# Patient Record
Sex: Male | Born: 1975 | Race: White | Hispanic: No | Marital: Married | Smoking: Current every day smoker
Health system: Southern US, Community
[De-identification: ages and names within clinical notes are randomized; demographics above are authoritative.]

## PROBLEM LIST (undated history)

## (undated) DIAGNOSIS — I10 Essential (primary) hypertension: Secondary | ICD-10-CM

## (undated) HISTORY — PX: COLON SURGERY: SHX602

## (undated) HISTORY — PX: APPENDECTOMY: SHX54

---

## 2005-02-06 ENCOUNTER — Ambulatory Visit: Payer: Self-pay

## 2005-03-04 ENCOUNTER — Ambulatory Visit: Payer: Self-pay | Admitting: Unknown Physician Specialty

## 2011-04-02 ENCOUNTER — Ambulatory Visit: Payer: Self-pay | Admitting: Family Medicine

## 2011-04-27 ENCOUNTER — Ambulatory Visit: Payer: Self-pay

## 2015-01-30 ENCOUNTER — Inpatient Hospital Stay: Payer: Self-pay | Admitting: Surgery

## 2015-02-10 ENCOUNTER — Ambulatory Visit: Payer: Self-pay | Admitting: Surgery

## 2015-02-10 ENCOUNTER — Inpatient Hospital Stay: Payer: Self-pay | Admitting: Surgery

## 2015-02-22 ENCOUNTER — Ambulatory Visit: Payer: Self-pay | Admitting: Surgery

## 2015-04-05 ENCOUNTER — Other Ambulatory Visit
Admit: 2015-04-05 | Disposition: A | Discharge status: Home / Self Care | Payer: Self-pay | Attending: Surgery | Admitting: Surgery

## 2015-04-05 LAB — COMPREHENSIVE METABOLIC PANEL
ALBUMIN: 3.8 g/dL
ANION GAP: 8 (ref 7–16)
AST: 24 U/L
Alkaline Phosphatase: 116 U/L
BUN: 5 mg/dL — AB
Bilirubin,Total: 0.4 mg/dL
CALCIUM: 9 mg/dL
CHLORIDE: 104 mmol/L
CREATININE: 0.77 mg/dL
Co2: 27 mmol/L
EGFR (African American): >60
Glucose: 96 mg/dL
Potassium: 3.8 mmol/L
SGPT (ALT): 35 U/L
Sodium: 139 mmol/L
Total Protein: 7.2 g/dL

## 2015-04-05 LAB — CBC WITH DIFFERENTIAL/PLATELET
Basophil #: 0 10*3/uL (ref 0.0–0.1)
Basophil %: 0.7 %
EOS ABS: 0.3 10*3/uL (ref 0.0–0.7)
Eosinophil %: 4.1 %
HCT: 40.8 % (ref 40.0–52.0)
HGB: 13.7 g/dL (ref 13.0–18.0)
Lymphocyte #: 2.7 10*3/uL (ref 1.0–3.6)
Lymphocyte %: 39.6 %
MCH: 27.7 pg (ref 26.0–34.0)
MCHC: 33.5 g/dL (ref 32.0–36.0)
MCV: 83 fL (ref 80–100)
MONO ABS: 0.3 x10 3/mm (ref 0.2–1.0)
Monocyte %: 5.2 %
NEUTROS PCT: 50.4 %
Neutrophil #: 3.4 10*3/uL (ref 1.4–6.5)
PLATELETS: 240 10*3/uL (ref 150–440)
RBC: 4.93 10*6/uL (ref 4.40–5.90)
RDW: 16.2 % — AB (ref 11.5–14.5)
WBC: 6.7 10*3/uL (ref 3.8–10.6)

## 2015-04-10 LAB — SURGICAL PATHOLOGY

## 2015-04-11 ENCOUNTER — Ambulatory Visit: Admit: 2015-04-11 | Disposition: A | Discharge status: Home / Self Care | Payer: Self-pay | Admitting: Surgery

## 2015-04-16 NOTE — Discharge Summary (Signed)
PATIENT NAME:  Seth Ramirez, Seth Ramirez MR#:  [id number] DATE OF BIRTH:  1976/04/19  DATE OF ADMISSION:  01/30/2015 DATE OF DISCHARGE:  02/02/2015  DISCHARGE DIAGNOSES:  1.  Acute ruptured appendicitis, status post laparoscopic appendectomy.  2.  History of knee surgery x 2.   DISCHARGE MEDICATIONS: As follows: Metronidazole 500 mg p.o. q. 8 hours, Percocet 1 to 2 tabs p.o. q. 6 hours p.r.n. pain, ciprofloxacin 500 mg  p.o. q. 12 hours.   INDICATION FOR ADMISSION: Mr. Seth Ramirez is a pleasant 39 year old with approximately 10 days of right lower quadrant pain. He had a CT scan concerning for ruptured appendicitis and leukocytosis. He was brought to the operating room for a laparoscopic appendectomy. Postoperatively a drain was placed. He was given clear liquids and IV pain medications. As he had increased his bowel function he was transitioned to p.o. pain medications  and regular diet. At the time of discharge, he was taking good p.o. with good p.o. pain control and voiding and stooling without difficulties.   DISCHARGE INSTRUCTIONS: As follows:  Mr. Seth Ramirez is  to call or return to the ED if has increased pain, nausea, vomiting, redness, drainage from incision.    ____________________________ Si Raiderhristopher A. Lyfe Reihl, MD cal:AT D: 02/15/2015 15:49:51 ET T: 02/16/2015 02:03:56 ET JOB#: [id number]  cc: Cristal Deerhristopher A. Nirali Magouirk, MD, <Dictator> Jarvis NewcomerHRISTOPHER A Anuoluwapo Mefferd MD ELECTRONICALLY SIGNED 02/21/2015 11:20

## 2015-04-16 NOTE — H&P (Signed)
PATIENT NAME:  Seth Ramirez, NOLT MR#:  [id number] DATE OF BIRTH:  [birth year]  DATE OF ADMISSION:  01/30/2015  PRIMARY CARE PHYSICIAN: None.   ADMITTING PHYSICIAN: Quentin Ore III, MD   CHIEF COMPLAINT: Abdominal pain.   HISTORY OF PRESENT ILLNESS:  Avory Mimbs is a 39 year old gentleman seen in the Emergency Room with an approximate 10-day history of right lower quadrant pain. He had a gastrointestinal bug by his history, causing profound nausea and vomiting and he developed right lower quadrant pain after that episode. He thought the pain represented a muscle strain from his vomiting and he continued to work. He denied any fever or chills.He  has not vomited any further. The pain increased over the course of the last couple of days and he went to the walk-in clinic yesterday, where is working diagnosis of muscle cramping was confirmed. The patient did not have laboratory values drawn at that time by his recollection. His pain worsened over the course today and he came to the Emergency Room for further evaluation. Workup demonstrated an elevated white blood cell count. A CT scan demonstrated right lower quadrant of free fluid, markedly dilated appendix, periappendiceal stranding suggestive of acute, possibly ruptured, appendicitis. The surgical service was consulted.   He denies any other previous GI problems. He denies history of hepatitis, yellow jaundice, pancreatitis, peptic ulcer disease, gallbladder disease or diverticulitis. He has had no previous abdominal surgery. His only previous surgery is anterior cruciate ligament repair x2. He denies history of cardiac disease, hypertension, diabetes or thyroid problems. He is a cigarette smoker. He does not drink alcohol regularly, but drink in the past. He is a Company secretary, doing manual labor.   REVIEW OF SYSTEMS: Otherwise unremarkable. Specifically, he has no chest pain, shortness of breath urinary symptoms or neurologic symptoms. A 10 point  review of systems was carried out and he has no other significant findings other than those noted above.   FAMILY HISTORY: Negative for cardiac disease, hypertension, or diabetes.   MEDICATIONS: He takes no medicines regularly.   ALLERGIES: HE HAS NO MEDICAL ALLERGIES.   PHYSICAL EXAMINATION:  GENERAL: He is an alert, pleasant gentleman in no significant distress. Does complain of mild right lower quadrant pain. His blood pressure is 149/93, heart rate 80 and regular. He is afebrile.  HEENT: Normal ears, normal eyes with no scleral icterus or pupillary abnormalities.  LYMPH SYSTEM: Reveals no adenopathy in his axilla, cervical area or groin.  PULMONARY EXAMINATION: Reveals no adventitious sounds with normal pulmonary excursion. He has equal breath sounds bilaterally.  CARDIAC: No murmurs or gallops. He seems to be in normal sinus rhythm.  GASTROINTESTINAL EXAMINATION: Reveals marked right lower quadrant tenderness with guarding and rebound. No hernias are noted. He has active bowel sounds.  MUSCULOSKELETAL SYSTEM: Reveals no deformities. Full range of motion.  NEUROLOGIC: Reveals no unilateral or focal abnormalities with good reflexes.  SKIN EXAMINATION: Reveals multiple small, erythematous dermatitis area across his abdomen and chest. He said these areas are chronic. No other skin abnormalities or bruising is identified.  PSYCHIATRIC: Normal orientation, normal affect.   I have independently reviewed his CT scan his laboratory values. He does appear to have acute appendicitis, possibly even ruptured, with a degree of inflammatory change noted on CT scan. We will plan to consider surgical intervention. I will set him up for surgery and then plan to pass his care to my associate, who will be assuming the service this evening. The patient is in agreement with  this plan.    ____________________________ Carmie Endalph L. Ely III, MD rle:ap D: 01/30/2015 18:33:27 ET T: 01/30/2015 19:07:17  ET JOB#: [id number]  cc: Quentin Orealph L. Ely III, MD, <Dictator> Quentin OreALPH L ELY MD ELECTRONICALLY SIGNED 01/31/2015 17:59

## 2015-04-16 NOTE — Op Note (Signed)
PATIENT NAME:  Seth Ramirez, [name] MR#:  [id number] DATE OF BIRTH:  1976/04/22  DATE OF PROCEDURE:  01/30/2015  ATTENDING PHYSICIAN:  Cristal Deerhristopher A. Deontez Klinke, MD   PREOPERATIVE DIAGNOSIS:  Acute appendicitis.   POSTOPERATIVE DIAGNOSIS:  Acute appendicitis, ruptured.   PROCEDURE PERFORMED:  Laparoscopic appendectomy.   ANESTHESIA:  General.   ESTIMATED BLOOD LOSS:  50 mL.   COMPLICATIONS:  None.   SPECIMEN:  Appendix.   INDICATION FOR PROCEDURE:  Mr. [name] is a pleasant 39 year old who presents with 10 days of right lower quadrant pain. He had a CT scan concerning for appendicitis and an elevated white blood cell count. He was brought to the operating room for laparoscopic appendectomy.   DETAILS OF PROCEDURE:  Informed consent was obtained. Mr. [name] was brought to the operating room suite. He was induced. Endotracheal tube was placed. General anesthesia was administered. A Foley was placed. His abdomen was prepped and draped in standard surgical fashion. A timeout was then performed correctly identifying patient name, operative site, and procedure to be performed. A supraumbilical incision was made and deepened down to the fascia. The fascia was incised. The peritoneum was entered. Two stay sutures were placed through the fasciotomy. A Hasson trocar was placed in the abdomen. Under direct visualization, a 5 mm left lower quadrant and right suprapubic catheter were placed. The appendix was visualized. It was inflamed. It was grasped. There was purulence and an abscess cavity around it. I followed the appendix back to the cecum. A stapler was placed across the base of the appendix flush with the cecum. It was stapled. Because the mesentery was extremely shortened, I used cautery to take down the mesoappendix. The appendix was then taken out through an Endo Catch bag. The abdomen was irrigated with 3 liters of normal saline. After happy with the irrigation, a round JP drain was placed out the  left lower quadrant incision into the appendiceal fossa. The drain was sutured with a 3-0 nylon suture. After happy with hemostasis, all trocars were removed under direct visualization and 0 Vicryl was used to close the supraumbilical fascia. After all ports were removed under direct visualization, 4-0 Monocryl was used to close the skin incisions. Sterile dressings were placed over the wound. The patient was then awoken, and Foley was removed. The patient was extubated. There were no immediate complications. Needle, sponge, and instrument counts were correct at the end of the procedure.    ____________________________ Si Raiderhristopher A. Morgin Halls, MD cal:nb D: 01/30/2015 23:57:34 ET T: 01/31/2015 02:52:17 ET JOB#: [id number]  cc: Cristal Deerhristopher A. Charlaine Utsey, MD, <Dictator> Jarvis NewcomerHRISTOPHER A Vollie Aaron MD ELECTRONICALLY SIGNED 02/02/2015 22:32

## 2015-04-16 NOTE — H&P (Signed)
History of Present Illness Seth Ramirez who underwent lap appy for ruptured appendicitis 11 days ago. He was D/C-ed on PO Cipro and Flagyl and initially did well, but over the last 4 days has developed anorexia, diarrhea, and RLQ pain. He denies fever and chills. No vomiting, and no BRBPR or melena. I personally reviewed the CT images from yesterday. They show at least 2 intraabdominal abscesses: the pelvic abscess and interloop abscesses cannot be percutaneously drained according to I.R. The other surrounds the cecum.   Past History additional ROS- complete 10 system review is negative except for pertinent positives and negatives of the GI system mentioned in the HPI. h/o knee surgery and lap appy as above   Code Status Full Code   ALLERGIES:  No Known Allergies:   HOME MEDICATIONS: Medication Instructions Status  metroNIDAZOLE 500 mg oral tablet 1 tab(s) orally every 8 hours Active  acetaminophen-oxyCODONE 325 mg-5 mg oral tablet 1 tab(s) orally every 6 hours, As needed, moderate pain (4-6/10) Active  ciprofloxacin 500 mg oral tablet 1 tab(s) orally every 12 hours Active   Family and Social History:  Family History Mom living at 4666, has CA (of unknown origin to pt). Dad died of an MI at 8665.   Social History positive  tobacco, negative ETOH, negative Illicit drugs, married, kids, works in Scientist, water qualitya warehouse, smokes 1 PPD, doesn't drink any ETOH (although he did in the remote past)   + Tobacco Current (within 1 year)   Place of Living Home   Review of Systems:  Fever/Chills No   Cough No   Sputum No   Abdominal Pain Yes   Diarrhea Yes   Constipation No   Nausea/Vomiting No   SOB/DOE No   Chest Pain No   Dysuria No   Tolerating PT Yes   Tolerating Diet No  anorexia   Physical Exam:  GEN well developed, well nourished, no acute distress   HEENT pink conjunctivae, PERRL, hearing intact to voice, moist oral mucosa, Oropharynx clear   NECK supple  No masses  trachea  midline   RESP normal resp effort  clear BS  no use of accessory muscles   CARD regular rate  no murmur  no thrills  no JVD  no Rub   ABD positive tenderness  RLQ   LYMPH negative neck   EXTR negative cyanosis/clubbing, negative edema   SKIN normal to palpation, skin turgor good   NEURO cranial nerves intact, negative tremor, follows commands, motor/sensory function intact   PSYCH alert, A+O to time, place, person   Radiology Results: CT:    26-Feb-16 08:52, CT Abdomen and Pelvis With Contrast  CT Abdomen and Pelvis With Contrast  REASON FOR EXAM:    Abd pain  COMMENTS:       PROCEDURE: KCT - KCT ABDOMEN/PELVIS W  - Feb 10 2015  8:52AM     ADDENDUM REPORT: 02/10/2015 09:35    ADDENDUM:  Study discussed by telephone with Dr. Tiney Rougealph Ely (covering fo rDr.  Ida RogueHRISTOPHER LUNDQUIST ) on 02/10/2015 at 0916 hours.    We discussed the small subcapsular liver collection (see #3) which I  favor at this time to be sterile rather than liver abscess.    Electronically Signed    By: Odessa FlemingH  Hall M.D.    On: 02/10/2015 09:35    CLINICAL DATA:  39 year old male status post ruptured appendix and  surgery with drain placed. Drain hold for days ago, with recurrent  chills fever diaphoresis abdominal pain  and vomiting. Initial  encounter.    EXAM:  CT ABDOMEN AND PELVIS WITH CONTRAST    TECHNIQUE:  Multidetector CT imaging of the abdomen and pelvis was performed  using the standard protocol following bolus administration of  intravenous contrast.    CONTRAST:  100 mL Omnipaque 350.    COMPARISON:  CT Abdomen and Pelvis 01/30/2015 and earlier.    FINDINGS:  Negative lung bases.  No pericardial or pleural effusion.    No acute osseous abnormality identified.    The bladder is diminutive. There is a new rim enhancing 64 x 62 x 69  mm abscess in the deep pelvis, situated between the rectum and  seminal vesicles. This was not area free fluid earlier.  The distal colon is mostly  collapsed. There is patchy free fluid in  the lower abdominal mesentery and stranding along both pelvic  sidewalls. Oral contrast has reached the descending colon. The  splenic flexure and transverse colon are within normal limits.    There is abundant inflammation in the right abdomen along the right  colon an the terminal ileum. There is severe wall thickening of the  terminal ileum and cecum with mass effect on the bowel lumen. There  is a rim enhancing abscess insinuated around the cecum and distal  small bowel in the right abdomen. The most confluent part of this  abscess encompasses 48 x 76 x 65 mm. See coronal image 45.    No upstream dilated small bowel. Stomach and duodenum are within  normal limits.  There is a new posterior inferior right hepatic lobe subcapsular  fluid collection measuring 3 cm (series 2, image 29) this is near  [place].    Elsewhere liver enhancement is within normal limits. Negative  gallbladder. Portal venous system is patent. Spleen, pancreas,  adrenal glands, kidneys, and major arterial structures are within  normal limits. No pneumoperitoneum.     IMPRESSION:  1. Moderate to large right abdominal abscess is discontinuous in  shape and has insinuated round distal small bowel loops, the  terminal ileum, and the cecum. The most confluent component measures  5-7 cm. See coronal image 45.  2. Round abscess in the deep pelvis measuring 6-7 cm diameter,  situated between the bladder and rectum.  3. Small 3 cm right posterior hepatic lobe subcapsular abscess  versus sterile fluid collection.  4. Severe terminal ileum and proximal colon wall thickening with  mass effect on the bowel lumen, but no definite mechanical  obstruction at this time.    Electronically Signed:  By: Odessa Fleming M.D.  On: 02/10/2015 09:06         Verified By: Kevan Ny. HALL, M.D.,    Assessment/Admission Diagnosis Pelvic, pericecal, and possibly interloop intraabdominal  abscesses, s/p lap appy   Plan Admit, IVF, IV ABx, operative drainage by Dr Juliann Pulse this evening.  Pt and wife understand the plan and agree.   Electronic Signatures: Claude Manges (MD)  (Signed [phone] 12:57)  Authored: CHIEF COMPLAINT and HISTORY, ALLERGIES, HOME MEDICATIONS, FAMILY AND SOCIAL HISTORY, REVIEW OF SYSTEMS, PHYSICAL EXAM, Radiology, ASSESSMENT AND PLAN   Last Updated: 26-Feb-16 12:57 by Claude Manges (MD)

## 2015-04-16 NOTE — Discharge Summary (Signed)
PATIENT NAME:  Seth Ramirez, Seth Ramirez MR#:  [id number] DATE OF BIRTH:  1976/03/19  DATE OF ADMISSION:  02/10/2015 DATE OF DISCHARGE:  02/17/2015  DISCHARGE DIAGNOSES:  1.  Abdominal abscess x 2 status post laparoscopic appendectomy.  2.  Status post drainage of right lower quadrant and pubic abscess with ileocecectomy.  3.  History of knee surgery.   DISCHARGE MEDICATIONS:  1.  Percocet 1-2 tabs p.o. q. 6 hours p.r.n. pain. 2.  Keflex 500 mg p.o. q.i.d.  3.  Flagyl 500 mg q. 8 hours.  INDICATION FOR ADMISSION: Mr. [name] is a pleasant [age] year old male who underwent laparoscopic appendectomy for ruptured appendicitis. He had a CT scan which showed a large intra-abdominal abscess and also is having increased pain, leukocytosis, nausea, vomiting, and diarrhea.  He is admitted for possible surgical management of postoperative abscess status post ruptured appendectomy.   HOSPITAL COURSE AS FOLLOWS:  Mr. [name] was admitted and underwent above-mentioned surgery. Postoperatively, he was n.p.o. until his bowels began working, and later advanced to a regular clear liquid, and then a regular diet. He was also given IV pain medicine by a PCA initially, and as he began tolerating diet this was transitioned to p.o. pain medications. He is also transitioned from IV to p.o. antibiotics as his condition improved.  At the time of discharge, he was taking good p.o. with good p.o. pain control, was voiding, and stooling without difficulty.   DISCHARGE INSTRUCTIONS: Mr. [name] is to call or return to the ED if has increased pain, nausea, vomiting, redness, drainage from incision. [facility]    ____________________________ Si Raiderhristopher A. Chicquita Mendel, MD cal:sp D: 03/07/2015 23:27:02 ET T: 03/08/2015 09:18:15 ET JOB#: [id number]  cc: Cristal Deerhristopher A. Marcile Fuquay, MD, <Dictator> Jarvis NewcomerHRISTOPHER A Christyne Mccain MD ELECTRONICALLY SIGNED 03/09/2015 19:44

## 2015-04-16 NOTE — Op Note (Signed)
PATIENT NAME:  Seth Ramirez, Seth Ramirez MR#:  [id number] DATE OF BIRTH:  19-May-1976  DATE OF PROCEDURE:  02/10/2015  ATTENDING SURGEON: Cristal Deer A. Varnika Butz, MD  PREOPERATIVE DIAGNOSIS: Intra-abdominal abscesses status post laparoscopic appendectomy for a ruptured appendicitis.   POSTOPERATIVE DIAGNOSIS: Intra-abdominal abscesses status post laparoscopic appendectomy for a ruptured appendicitis.  PROCEDURE PERFORMED:  1.  Attempted diagnostic laparoscopy with drainage of abscesses.  2.  Exploratory laparotomy with drainage of abscesses.  3.  Ileocecectomy with primary anastomosis.    ESTIMATED BLOOD LOSS: 300 mL.   COMPLICATIONS: None.   SPECIMEN: Ileocecum.   COMPLICATIONS: None.   INDICATION FOR SURGERY: [name] is a pleasant 39 year old male who presents with 11 days of worsening abdominal pain. He had a leukocytosis and a CT scan concerning for multiple intra-abdominal abscesses, which were not amenable to percutaneous drainage. He was brought to the operating room for drainage of abscesses.   DETAILS OF PROCEDURE: Informed consent was obtained. [name] was brought to the operating room suite. He was induced. Endotracheal tube was placed, general anesthesia was administered. His abdomen was prepped and draped in standard surgical fashion. A timeout was then performed correctly identify patient name, operative site, and procedure performed. A supraumbilical incision was made through the previous, now closed, supraumbilical incision and depend down to the fascia. The previously placed fascial sutures were clipped. The peritoneum was entered. Two stay sutures were placed through the fasciotomy. Hassan trocar was placed in the abdomen. It was insufflated. There were a large amount of adhesions. A trocar was placed at through the area of the previous drain. I then attempted to take down the adhesions from the abdominal wall with some difficulties; however, I felt that it was not  progressing and everything was very stuck. I then converted to an open incision. I then opened the pelvic abscess cavity with purulence through blunt dissection. The sigmoid colon was quite inflamed and hard. I then proceeded to run the small bowel and there was a significant amount of terminal ileum stuck in the right upper quadrant to an abscess. This was a partially deserosalized. I then mobilized the right colon with some difficulty, at least at the cecum. Upon examination, there was approximately 20 cm of deserosalized ileum. It appeared to be that there was enough length to do a side-to-side functional anastomosis; however, I did find a small enterotomy at the very friable terminal ileum as well as a small colotomy in the cecum. Attempts to close these were futile due to the quality of the tissue, which kept tearing with suture placement. I then elected for an ileocecectomy due to the fact that I was afraid if I closed the pinholes that this would form a leak. I then removed damage bowel to area of fresh uninvolved small bowel and large bowel. These were ligated with a GIA 75 then suture ligatures in the mesentery. I then performed a side-to-side functional end-to-end anastomosis by creating a common channel with a GIA 75 stapler and then closing the enterocolostomy with a TX 60. I then irrigated the abdomen with 10 L of normal saline. Hemostasis was obtained. Following this, I closed the wound with 2 looped #1 PDS run from both directions. They were tied in the middle. The wound was then loosely approximated with staples and packing gauze was placed due to the fact that the wound was dirty. Sterile dressing was then placed over the wound. The patient was then awoken, extubated and brought to the postanesthesia care unit. There  were no immediate complications. Needle, sponge, and instrument counts were correct at the end of the procedure.     ____________________________ [name],  MD cal:bm D: 02/11/2015 19:24:00 ET T: 02/12/2015 01:18:32 ET JOB#: [id number]  cc: Cristal Deerhristopher A. Christinea Brizuela, MD, <Dictator> Jarvis NewcomerHRISTOPHER A Lynnix Schoneman MD ELECTRONICALLY SIGNED 02/21/2015 11:27

## 2015-06-14 ENCOUNTER — Emergency Department: Payer: 59

## 2015-06-14 ENCOUNTER — Other Ambulatory Visit: Payer: 59

## 2015-06-14 ENCOUNTER — Other Ambulatory Visit: Payer: Self-pay

## 2015-06-14 ENCOUNTER — Emergency Department
Admission: EM | Admit: 2015-06-14 | Discharge: 2015-06-14 | Disposition: A | Discharge status: Home / Self Care | Payer: 59 | Attending: Emergency Medicine | Admitting: Emergency Medicine

## 2015-06-14 ENCOUNTER — Encounter: Payer: Self-pay | Admitting: *Deleted

## 2015-06-14 DIAGNOSIS — Z720 Tobacco use: Secondary | ICD-10-CM | POA: Diagnosis not present

## 2015-06-14 DIAGNOSIS — R109 Unspecified abdominal pain: Secondary | ICD-10-CM | POA: Diagnosis present

## 2015-06-14 DIAGNOSIS — Z9049 Acquired absence of other specified parts of digestive tract: Secondary | ICD-10-CM | POA: Diagnosis not present

## 2015-06-14 LAB — CBC WITH DIFFERENTIAL/PLATELET
BASOS ABS: 0.1 10*3/uL (ref 0–0.1)
BASOS PCT: 1 %
EOS ABS: 0.1 10*3/uL (ref 0–0.7)
Eosinophils Relative: 2 %
HCT: 43.2 % (ref 40.0–52.0)
HEMOGLOBIN: 14.4 g/dL (ref 13.0–18.0)
Lymphocytes Relative: 37 %
Lymphs Abs: 2.6 10*3/uL (ref 1.0–3.6)
MCH: 27.8 pg (ref 26.0–34.0)
MCHC: 33.4 g/dL (ref 32.0–36.0)
MCV: 83.1 fL (ref 80.0–100.0)
MONOS PCT: 4 %
Monocytes Absolute: 0.3 10*3/uL (ref 0.2–1.0)
NEUTROS ABS: 4 10*3/uL (ref 1.4–6.5)
NEUTROS PCT: 56 %
PLATELETS: 247 10*3/uL (ref 150–440)
RBC: 5.2 MIL/uL (ref 4.40–5.90)
RDW: 16.3 % — AB (ref 11.5–14.5)
WBC: 7.2 10*3/uL (ref 3.8–10.6)

## 2015-06-14 LAB — COMPREHENSIVE METABOLIC PANEL
ALT: 15 U/L — ABNORMAL LOW (ref 17–63)
ANION GAP: 9 (ref 5–15)
AST: 18 U/L (ref 15–41)
Albumin: 4.1 g/dL (ref 3.5–5.0)
Alkaline Phosphatase: 92 U/L (ref 38–126)
BUN: 9 mg/dL (ref 6–20)
CO2: 27 mmol/L (ref 22–32)
Calcium: 8.8 mg/dL — ABNORMAL LOW (ref 8.9–10.3)
Chloride: 103 mmol/L (ref 101–111)
Creatinine, Ser: 0.87 mg/dL (ref 0.61–1.24)
GFR calc Af Amer: >60 mL/min (ref >60)
GFR calc non Af Amer: >60 mL/min (ref >60)
Glucose, Bld: 108 mg/dL — ABNORMAL HIGH (ref 65–99)
Potassium: 3 mmol/L — ABNORMAL LOW (ref 3.5–5.1)
SODIUM: 139 mmol/L (ref 135–145)
TOTAL PROTEIN: 7.3 g/dL (ref 6.5–8.1)
Total Bilirubin: 0.5 mg/dL (ref 0.3–1.2)

## 2015-06-14 LAB — LIPASE, BLOOD: Lipase: 30 U/L (ref 22–51)

## 2015-06-14 MED ORDER — DICYCLOMINE HCL 10 MG PO CAPS: 10.0000 mg | ORAL_CAPSULE | Freq: Three times a day (TID) | ORAL | Status: AC

## 2015-06-14 MED ORDER — IOHEXOL 240 MG/ML SOLN
25.0000 mL | Freq: Once | INTRAMUSCULAR | Status: AC | PRN
  Administered 2015-06-14: 25 mL via INTRAVENOUS
  Filled 2015-06-14: qty 25

## 2015-06-14 MED ORDER — IOHEXOL 300 MG/ML  SOLN
100.0000 mL | Freq: Once | INTRAMUSCULAR | Status: AC | PRN
  Administered 2015-06-14: 100 mL via INTRAVENOUS
  Filled 2015-06-14: qty 100

## 2015-06-14 MED ORDER — METOCLOPRAMIDE HCL 5 MG PO TABS: 5.0000 mg | ORAL_TABLET | Freq: Three times a day (TID) | ORAL | Status: AC

## 2015-06-14 MED ORDER — SODIUM CHLORIDE 0.9 % IV BOLUS (SEPSIS)
1000.0000 mL | Freq: Once | INTRAVENOUS | Status: AC
  Administered 2015-06-14: 1000 mL via INTRAVENOUS

## 2015-06-14 NOTE — ED Notes (Signed)
Pt complains of right sided abdominal pain starting Monday, pt denies any other symptoms

## 2015-06-14 NOTE — Discharge Instructions (Signed)
Your blood tests and CT scan looked good. It is unclear what is causing the notable pain even having. He do have a moderate amount of stool in the right upper portion of her colon as well as some gas in that area. This may be causing some discomfort. Take Reglan as needed for nausea. This may help with discomfort. You may also take an anti-spasmodic for your bowels.  Follow-up with Dr. Juliann PulseLundquist in 1-2 days area if your pain worsens or you have other urgent concerns, return to the emergency department.  Abdominal Pain Many things can cause abdominal pain. Usually, abdominal pain is not caused by a disease and will improve without treatment. It can often be observed and treated at home. Your health care provider will do a physical exam and possibly order blood tests and X-rays to help determine the seriousness of your pain. However, in many cases, more time must pass before a clear cause of the pain can be found. Before that point, your health care provider may not know if you need more testing or further treatment. HOME CARE INSTRUCTIONS  Monitor your abdominal pain for any changes. The following actions may help to alleviate any discomfort you are experiencing:  Only take over-the-counter or prescription medicines as directed by your health care provider.  Do not take laxatives unless directed to do so by your health care provider.  Try a clear liquid diet (broth, tea, or water) as directed by your health care provider. Slowly move to a bland diet as tolerated. SEEK MEDICAL CARE IF:  You have unexplained abdominal pain.  You have abdominal pain associated with nausea or diarrhea.  You have pain when you urinate or have a bowel movement.  You experience abdominal pain that wakes you in the night.  You have abdominal pain that is worsened or improved by eating food.  You have abdominal pain that is worsened with eating fatty foods.  You have a fever. SEEK IMMEDIATE MEDICAL CARE IF:    Your pain does not go away within 2 hours.  You keep throwing up (vomiting).  Your pain is felt only in portions of the abdomen, such as the right side or the left lower portion of the abdomen.  You pass bloody or black tarry stools. MAKE SURE YOU:  Understand these instructions.   Will watch your condition.   Will get help right away if you are not doing well or get worse.  Document Released: 09/11/2005 Document Revised: 12/07/2013 Document Reviewed: 08/11/2013 [facility] Patient Information 2015 [employer] [place]. This information is not intended to replace advice given to you by your health care provider. Make sure you discuss any questions you have with your health care provider.

## 2015-06-14 NOTE — ED Provider Notes (Signed)
[facility] Emergency Department Provider Note  ____________________________________________  Time seen: 1450  I have reviewed the triage vital signs and the nursing notes.   HISTORY  Chief Complaint Abdominal Pain     HPI Seth Ramirez is a 39 y.o. male with a complicated recent surgical history. He had appendicitis earlier this year. He apparently developed some form of infection afterwards and the need to do a repeat surgery. The repeat surgery removed part of his intestines. He now presents to the emergency department with pain in his right abdomen that began on Monday. This pain has been moderate until today when he felt some sort of a "pull". He reports he had been told to return to the emergency department if he felt any form of a "pop".  He reports some nausea and vomiting. He has not had any diarrhea.     History reviewed. No pertinent past medical history.  There are no active problems to display for this patient.   Past Surgical History  Procedure Laterality Date  . Appendectomy    . Colon surgery      Current Outpatient Rx  Name  Route  Sig  Dispense  Refill  . dicyclomine (BENTYL) 10 MG capsule   Oral   Take 1 capsule (10 mg total) by mouth 4 (four) times daily -  before meals and at bedtime.   16 capsule   0   . metoCLOPramide (REGLAN) 5 MG tablet   Oral   Take 1 tablet (5 mg total) by mouth 3 (three) times daily.   15 tablet   0     Allergies Review of patient's allergies indicates no known allergies.  No family history on file.  Social History History  Substance Use Topics  . Smoking status: Current Every Day Smoker -- 1.00 packs/day  . Smokeless tobacco: Not on file  . Alcohol Use: No    Review of Systems  Constitutional: Negative for fever. ENT: Negative for sore throat. Cardiovascular: Negative for chest pain. Respiratory: Negative for shortness of breath. Gastrointestinal: Positive for abdominal pain.  Complex past surgical history. See history of present illness Genitourinary: Negative for dysuria. Musculoskeletal: No myalgias or injuries. Skin: Negative for rash. Neurological: Negative for headaches   10-point ROS otherwise negative.  ____________________________________________   PHYSICAL EXAM:  VITAL SIGNS: ED Triage Vitals  Enc Vitals Group     BP 06/14/15 1208 174/100 mmHg     Pulse Rate 06/14/15 1208 86     Resp 06/14/15 1208 20     Temp 06/14/15 1208 98.2 F (36.8 C)     Temp Source 06/14/15 1208 Oral     SpO2 06/14/15 1208 98 %     Weight 06/14/15 1208 205 lb (92.987 kg)     Height 06/14/15 1208  (1.778 m)     Head Cir --      Peak Flow --      Pain Score --      Pain Loc --      Pain Edu? --      Excl. in GC? --     Constitutional:  Alert and oriented. Well appearing and in no distress. ENT   Head: Normocephalic and atraumatic.   Nose: No congestion/rhinnorhea. Cardiovascular: Normal rate, regular rhythm, no murmur noted Respiratory:  Normal respiratory effort, no tachypnea.    Breath sounds are clear and equal bilaterally.  Gastrointestinal: Soft, No distention.  Patient does have some tenderness that is generally diffuse but  primarily on the right. When pushing on the left side of the abdomen, he has some referred pain to the midline. The higher up on the left we push the greater the amount of discomfort to the right. He has no guarding and no rebound. Back: No muscle spasm, no tenderness, no CVA tenderness. Musculoskeletal: No deformity noted. Nontender with normal range of motion in all extremities.  No noted edema. Neurologic:  Normal speech and language. No gross focal neurologic deficits are appreciated.  Skin:  Skin is warm, dry. 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 Psychiatric: Mood and affect are normal. Speech and behavior are normal.  ____________________________________________    LABS (pertinent  positives/negatives)  White blood cell count 7.2 with normal hemoglobin 14.4 Sodium 139, potassium 3.0, normal renal function.  ____________________________________________   EKG  ED ECG REPORT I, Lanyiah Brix W, the attending physician, personally viewed and interpreted this ECG.   Date: 06/14/2015  EKG Time: 1225  Rate: 75  Rhythm: Normal sinus rhythm  Axis: Left axis deviation at -31  Intervals: QTC of 460  ST&T Change: Downward T-wave in lead 3 and flat in aVF.   ____________________________________________    RADIOLOGY  CT abdomen and pelvis: IMPRESSION: 1. There is no acute inflammatory process within abdomen or pelvis. 2. Postsurgical changes are noted with midline anterior abdominal wall scarring. The patient is status post partial right hemicolectomy. 3. Moderate stool noted in hepatic flexure of the colon. Some colonic gas noted in transverse colon and rectum. There is no evidence of colonic obstruction. No colitis or diverticulitis. 4. Mild hepatic fatty infiltration. 5. No hydronephrosis or hydroureter. No small bowel obstruction.  ____________________________________________  ____________________________________________   INITIAL IMPRESSION / ASSESSMENT AND PLAN / ED COURSE  Pertinent labs & imaging results that were available during my care of the patient were reviewed by me and considered in my medical decision making (see chart for details).  This patient has a complex recent surgical history. While I am a little reluctant to put him through yet another CT scan, he is at moderate or higher risk for a complication. The patient and I have discussed the pros and cons of CT versus discharge and reevaluation and the patient prefers to have a CT today.  I have called and spoken with Natale LayMark Bird, MD to get his input on the next best step for this patient. He agrees with obtaining the CT.  ----------------------------------------- 5:30 PM on  06/14/2015 -----------------------------------------  The CT scan does not show any acute process. Post surgical changes are noted. He does have moderate stool and some gas in the hepatic flexure and transverse colon.  I will discharge the patient for follow-up with general surgery.  ____________________________________________   FINAL CLINICAL IMPRESSION(S) / ED DIAGNOSES  Final diagnoses:  Abdominal pain in male  Abdominal pain 9 months post surgery    Darien Ramusavid W Amri Lien, MD 06/14/15 [phone]

## 2020-04-27 ENCOUNTER — Ambulatory Visit: Payer: 59 | Attending: Internal Medicine

## 2020-04-27 DIAGNOSIS — Z23 Encounter for immunization: Secondary | ICD-10-CM

## 2020-04-27 NOTE — Progress Notes (Signed)
   Covid-19 Vaccination Clinic  Name:  Seth Ramirez    MRN: [id number] DOB: [birth year]  04/27/2020  [name] was observed post Covid-19 immunization for 15 minutes without incident. He was provided with Vaccine Information Sheet and instruction to access the V-Safe system.   [name] was instructed to call 911 with any severe reactions post vaccine: Marland Kitchen Difficulty breathing  . Swelling of face and throat  . A fast heartbeat  . A bad rash all over body  . Dizziness and weakness   Immunizations Administered    Name Date Dose VIS Date Route   Pfizer COVID-19 Vaccine 04/27/2020  1:39 PM 0.3 mL 02/09/2019 Intramuscular   Manufacturer: [employer], [employer]   Lot: [id number]   NDC: [address]-9

## 2020-05-22 ENCOUNTER — Ambulatory Visit: Payer: Self-pay | Attending: Internal Medicine

## 2020-05-22 DIAGNOSIS — Z23 Encounter for immunization: Secondary | ICD-10-CM

## 2020-05-22 NOTE — Progress Notes (Signed)
° °  Covid-19 Vaccination Clinic  Name:  YEDIDYA DUDDY    MRN: [id number] DOB: July 24, 1976  05/22/2020  Mr. Bencosme was observed post Covid-19 immunization for 15 minutes without incident. He was provided with Vaccine Information Sheet and instruction to access the V-Safe system.   Mr. Rowe was instructed to call 911 with any severe reactions post vaccine:  Difficulty breathing   Swelling of face and throat   A fast heartbeat   A bad rash all over body   Dizziness and weakness   Immunizations Administered    Name Date Dose VIS Date Route   Pfizer COVID-19 Vaccine 05/22/2020  4:11 PM 0.3 mL 02/09/2019 Intramuscular   Manufacturer: [employer], [employer]   Lot: [id number]   NDC: [address]-2

## 2021-03-07 ENCOUNTER — Emergency Department: Payer: Self-pay | Attending: Physician Assistant

## 2021-03-07 ENCOUNTER — Other Ambulatory Visit: Payer: Self-pay

## 2021-03-07 ENCOUNTER — Emergency Department
Admission: EM | Admit: 2021-03-07 | Discharge: 2021-03-07 | Disposition: A | Discharge status: Home / Self Care | Payer: No Typology Code available for payment source | Attending: Emergency Medicine | Admitting: Emergency Medicine

## 2021-03-07 DIAGNOSIS — X500XXA Overexertion from strenuous movement or load, initial encounter: Secondary | ICD-10-CM | POA: Insufficient documentation

## 2021-03-07 DIAGNOSIS — I10 Essential (primary) hypertension: Secondary | ICD-10-CM | POA: Diagnosis not present

## 2021-03-07 DIAGNOSIS — S46911A Strain of unspecified muscle, fascia and tendon at shoulder and upper arm level, right arm, initial encounter: Secondary | ICD-10-CM | POA: Insufficient documentation

## 2021-03-07 DIAGNOSIS — Y990 Civilian activity done for income or pay: Secondary | ICD-10-CM | POA: Diagnosis not present

## 2021-03-07 DIAGNOSIS — S4991XA Unspecified injury of right shoulder and upper arm, initial encounter: Secondary | ICD-10-CM | POA: Diagnosis present

## 2021-03-07 DIAGNOSIS — Z79899 Other long term (current) drug therapy: Secondary | ICD-10-CM | POA: Diagnosis not present

## 2021-03-07 DIAGNOSIS — F17200 Nicotine dependence, unspecified, uncomplicated: Secondary | ICD-10-CM | POA: Diagnosis not present

## 2021-03-07 LAB — COMPREHENSIVE METABOLIC PANEL
ALT: 14 U/L (ref 0–44)
AST: 17 U/L (ref 15–41)
Albumin: 4.5 g/dL (ref 3.5–5.0)
Alkaline Phosphatase: 92 U/L (ref 38–126)
Anion gap: 8 (ref 5–15)
BUN: 11 mg/dL (ref 6–20)
CO2: 24 mmol/L (ref 22–32)
Calcium: 9.1 mg/dL (ref 8.9–10.3)
Chloride: 105 mmol/L (ref 98–111)
Creatinine, Ser: 0.86 mg/dL (ref 0.61–1.24)
GFR, Estimated: >60 mL/min (ref >60)
Glucose, Bld: 87 mg/dL (ref 70–99)
Potassium: 3.5 mmol/L (ref 3.5–5.1)
Sodium: 137 mmol/L (ref 135–145)
Total Bilirubin: 1 mg/dL (ref 0.3–1.2)
Total Protein: 7.7 g/dL (ref 6.5–8.1)

## 2021-03-07 LAB — CBC WITH DIFFERENTIAL/PLATELET
Abs Immature Granulocytes: 0.02 10*3/uL (ref 0.00–0.07)
Basophils Absolute: 0 10*3/uL (ref 0.0–0.1)
Basophils Relative: 1 %
Eosinophils Absolute: 0.1 10*3/uL (ref 0.0–0.5)
Eosinophils Relative: 1 %
HCT: 48.6 % (ref 39.0–52.0)
Hemoglobin: 16.9 g/dL (ref 13.0–17.0)
Immature Granulocytes: 0 %
Lymphocytes Relative: 32 %
Lymphs Abs: 2.1 10*3/uL (ref 0.7–4.0)
MCH: 30 pg (ref 26.0–34.0)
MCHC: 34.8 g/dL (ref 30.0–36.0)
MCV: 86.3 fL (ref 80.0–100.0)
Monocytes Absolute: 0.4 10*3/uL (ref 0.1–1.0)
Monocytes Relative: 6 %
Neutro Abs: 3.8 10*3/uL (ref 1.7–7.7)
Neutrophils Relative %: 60 %
Platelets: 224 10*3/uL (ref 150–400)
RBC: 5.63 MIL/uL (ref 4.22–5.81)
RDW: 13.5 % (ref 11.5–15.5)
WBC: 6.4 10*3/uL (ref 4.0–10.5)
nRBC: 0 % (ref 0.0–0.2)

## 2021-03-07 MED ORDER — MELOXICAM 15 MG PO TABS: 15.0000 mg | ORAL_TABLET | Freq: Every day | ORAL | 2 refills | Status: AC

## 2021-03-07 MED ORDER — CLONIDINE HCL 0.1 MG PO TABS
0.2000 mg | ORAL_TABLET | Freq: Once | ORAL | Status: AC
  Administered 2021-03-07: 0.2 mg via ORAL
  Filled 2021-03-07: qty 2

## 2021-03-07 MED ORDER — AMLODIPINE BESYLATE 5 MG PO TABS: 5.0000 mg | ORAL_TABLET | Freq: Every day | ORAL | 11 refills | Status: DC

## 2021-03-07 MED ORDER — BACLOFEN 10 MG PO TABS: 10.0000 mg | ORAL_TABLET | Freq: Three times a day (TID) | ORAL | 0 refills | Status: AC

## 2021-03-07 NOTE — ED Provider Notes (Signed)
[facility] Emergency Department Provider Note  ____________________________________________   Event Date/Time   First MD Initiated Contact with Patient 03/07/21 1152     (approximate)  I have reviewed the triage vital signs and the nursing notes.   HISTORY  Chief Complaint Shoulder Injury    HPI Seth Ramirez is a 45 y.o. male presents emergency department complaining of right shoulder pain.  Patient states that he was lifting 120 pounds from the floor and throwing overhead.  Patient went to [facility] for [employer]. injury of his right shoulder but they noted that his blood pressure was extremely high and sent him here for hypertensive emergency.  Patient does not have a history of hypertension.  States his father does.  He has never been on medication.  He did drink NyQuil last night prior to going to bed.  He is taking no cold medications and did not drink a lot of caffeine today.   He denies headache.  Denies chest pain or shortness of breath   History reviewed. No pertinent past medical history.  There are no problems to display for this patient.   Past Surgical History:  Procedure Laterality Date  . APPENDECTOMY    . COLON SURGERY      Prior to Admission medications   Medication Sig Start Date End Date Taking? Authorizing Provider  amLODipine (NORVASC) 5 MG tablet Take 1 tablet (5 mg total) by mouth daily. 03/07/21 03/07/22 Yes Fisher, Roselyn Bering, PA-C  baclofen (LIORESAL) 10 MG tablet Take 1 tablet (10 mg total) by mouth 3 (three) times daily for 7 days. 03/07/21 03/14/21 Yes Fisher, Roselyn Bering, PA-C  meloxicam (MOBIC) 15 MG tablet Take 1 tablet (15 mg total) by mouth daily. 03/07/21 03/07/22 Yes Fisher, Roselyn Bering, PA-C  dicyclomine (BENTYL) 10 MG capsule Take 1 capsule (10 mg total) by mouth 4 (four) times daily -  before meals and at bedtime. 06/14/15   Darien Ramus, MD  metoCLOPramide (REGLAN) 5 MG tablet Take 1 tablet (5 mg total) by  mouth 3 (three) times daily. 06/14/15   Darien Ramus, MD    Allergies Patient has no known allergies.  No family history on file.  Social History Social History   Tobacco Use  . Smoking status: Current Every Day Smoker    Packs/day: 1.00  Substance Use Topics  . Alcohol use: No    Review of Systems  Constitutional: No fever/chills Eyes: No visual changes. ENT: No sore throat. Respiratory: Denies cough Cardiovascular: Denies chest pain Gastrointestinal: Denies abdominal pain Genitourinary: Negative for dysuria. Musculoskeletal: Negative for back pain.  Right shoulder pain Skin: Negative for rash. Psychiatric: no mood changes,     ____________________________________________   PHYSICAL EXAM:  VITAL SIGNS: ED Triage Vitals  Enc Vitals Group     BP 03/07/21 1117 (!) 209/131     Pulse Rate 03/07/21 1117 79     Resp 03/07/21 1117 16     Temp 03/07/21 1117 98.5 F (36.9 C)     Temp Source 03/07/21 1117 Oral     SpO2 03/07/21 1117 98 %     Weight 03/07/21 1119 220 lb (99.8 kg)     Height 03/07/21 1119 5\' 10"  (1.778 m)     Head Circumference --      Peak Flow --      Pain Score 03/07/21 1118 4     Pain Loc --      Pain Edu? --  Excl. in GC? --     Constitutional: Alert and oriented. Well appearing and in no acute distress.   Eyes: Conjunctivae are normal.  Head: Atraumatic. Nose: No congestion/rhinnorhea. Mouth/Throat: Mucous membranes are moist.   Neck:  supple no lymphadenopathy noted Cardiovascular: Normal rate, regular rhythm. Heart sounds are normal Respiratory: Normal respiratory effort.  No retractions, lungs c t a  GU: deferred Musculoskeletal: FROM all extremities, warm and well perfused, right shoulder tender along the trapezius and supraspinatus muscle, no rotator cuff injury noted at this time Neurologic:  Normal speech and language.  Skin:  Skin is warm, dry and intact. No rash noted. Psychiatric: Mood and affect are normal. Speech and  behavior are normal.  ____________________________________________   LABS (all labs ordered are listed, but only abnormal results are displayed)  Labs Reviewed  COMPREHENSIVE METABOLIC PANEL  CBC WITH DIFFERENTIAL/PLATELET  URINALYSIS, COMPLETE (UACMP) WITH MICROSCOPIC   ____________________________________________   ____________________________________________  RADIOLOGY  X-ray of the right shoulder  ____________________________________________   PROCEDURES  Procedure(s) performed: Catapres 0.2 mg p.o.  Procedures    ____________________________________________   INITIAL IMPRESSION / ASSESSMENT AND PLAN / ED COURSE  Pertinent labs & imaging results that were available during my care of the patient were reviewed by me and considered in my medical decision making (see chart for details).   Patient is a 45 year old male sent over by [facility] clinic for hypertensive emergency but also is here for [employer]. injury of the right shoulder.  See HPI physical exam shows patient's blood pressure be grossly elevated at 212/125  X-ray of the right shoulder Patient was given Catapres 0.2 CBC, metabolic panel,    X-ray of the right shoulder reviewed by me confirmed by radiology to be negative  CBC and metabolic panel are normal   Patient's blood pressure did decrease to 153/100 with the Catapres 0.2 mg.  The patient was given a prescription for amlodipine 5 mg daily.  He is to have his blood pressure rechecked at either the pharmacy, fire station, or EMS station in 1 week to ensure this is continuing to decrease.  If he is worsening he is to return the emergency department.  In regards to his shoulder, Worker's Comp. form filled out that he should have no use of the right shoulder for 1 week.  He is to take meloxicam and baclofen for shoulder.  Apply ice.  Return emergency department if worsening.  Follow-up orthopedics if not improving in 5 to 7 days.  Is discharged  stable condition.  Seth Ramirez was evaluated in Emergency Department on 03/07/2021 for the symptoms described in the history of present illness. He was evaluated in the context of the global COVID-19 pandemic, which necessitated consideration that the patient might be at risk for infection with the SARS-CoV-2 virus that causes COVID-19. Institutional protocols and algorithms that pertain to the evaluation of patients at risk for COVID-19 are in a state of rapid change based on information released by regulatory bodies including the CDC and federal and state organizations. These policies and algorithms were followed during the patient's care in the ED.    As part of my medical decision making, I reviewed the following data within the electronic MEDICAL RECORD NUMBER Nursing notes reviewed and incorporated, Labs reviewed , Old chart reviewed, Radiograph reviewed , Notes from prior ED visits and [place] Controlled Substance Database  ____________________________________________   FINAL CLINICAL IMPRESSION(S) / ED DIAGNOSES  Final diagnoses:  Primary hypertension  Shoulder strain, right, initial  encounter      NEW MEDICATIONS STARTED DURING THIS VISIT:  Discharge Medication List as of 03/07/2021  2:57 PM    START taking these medications   Details  amLODipine (NORVASC) 5 MG tablet Take 1 tablet (5 mg total) by mouth daily., Starting Wed 03/07/2021, Until Thu 03/07/2022, Normal    baclofen (LIORESAL) 10 MG tablet Take 1 tablet (10 mg total) by mouth 3 (three) times daily for 7 days., Starting Wed 03/07/2021, Until Wed 03/14/2021, Normal    meloxicam (MOBIC) 15 MG tablet Take 1 tablet (15 mg total) by mouth daily., Starting Wed 03/07/2021, Until Thu 03/07/2022, Normal         Note:  This document was prepared using Dragon voice recognition software and may include unintentional dictation errors.    Faythe Ghee, PA-C 03/07/21 1549    Merwyn Katos, MD 03/07/21 [phone]

## 2021-03-07 NOTE — ED Triage Notes (Addendum)
Pt to ER via POV from [facility] with c/o R shoulder pain. Pt reports lifting heavy boxes at work. Pt has full range of motion with associated pain worst with movement. Pain extends into neck.  [facility] clinic referred patient to ER d/t hypertension with systolic readings of 200. Pt BP reading 209/131 in triage. Pt denies hx of hypertension, no symptoms and denies headache or other symptoms related to blood pressure. Hypertension discussed with Md Bradler. Per Md, okay for flex care.

## 2021-03-07 NOTE — ED Notes (Signed)
Workers comp completed and turned in to [employer] for [employer].

## 2021-03-07 NOTE — Discharge Instructions (Signed)
Follow-up with orthopedics for continued right shoulder pain [facility] your [facility] Take the blood pressure medication as prescribed, recheck your blood pressure with either a pharmacy, EMS station, or fire station in 1 week Apply ice to your right shoulder, take the meloxicam and baclofen for the muscle strain

## 2021-03-07 NOTE — ED Notes (Signed)
Pt provided cup for urine sample 

## 2021-03-07 NOTE — ED Notes (Signed)
See triage note, pt filing workers comp, works for [employer]. 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 No obvious deformity noted

## 2021-03-07 NOTE — ED Notes (Signed)
Pt to xray

## 2021-03-11 ENCOUNTER — Emergency Department
Admission: EM | Admit: 2021-03-11 | Discharge: 2021-03-11 | Disposition: A | Discharge status: Home / Self Care | Payer: Self-pay | Attending: Emergency Medicine | Admitting: Emergency Medicine

## 2021-03-11 ENCOUNTER — Other Ambulatory Visit: Payer: Self-pay

## 2021-03-11 ENCOUNTER — Emergency Department: Payer: Self-pay

## 2021-03-11 ENCOUNTER — Encounter: Payer: Self-pay | Admitting: Emergency Medicine

## 2021-03-11 DIAGNOSIS — R778 Other specified abnormalities of plasma proteins: Secondary | ICD-10-CM

## 2021-03-11 DIAGNOSIS — F17200 Nicotine dependence, unspecified, uncomplicated: Secondary | ICD-10-CM | POA: Insufficient documentation

## 2021-03-11 DIAGNOSIS — I10 Essential (primary) hypertension: Secondary | ICD-10-CM | POA: Insufficient documentation

## 2021-03-11 DIAGNOSIS — R7989 Other specified abnormal findings of blood chemistry: Secondary | ICD-10-CM | POA: Insufficient documentation

## 2021-03-11 DIAGNOSIS — Z79899 Other long term (current) drug therapy: Secondary | ICD-10-CM | POA: Insufficient documentation

## 2021-03-11 HISTORY — DX: Essential (primary) hypertension: I10

## 2021-03-11 LAB — CBC WITH DIFFERENTIAL/PLATELET
Abs Immature Granulocytes: 0.02 10*3/uL (ref 0.00–0.07)
Basophils Absolute: 0.1 10*3/uL (ref 0.0–0.1)
Basophils Relative: 1 %
Eosinophils Absolute: 0.2 10*3/uL (ref 0.0–0.5)
Eosinophils Relative: 2 %
HCT: 47.1 % (ref 39.0–52.0)
Hemoglobin: 16.3 g/dL (ref 13.0–17.0)
Immature Granulocytes: 0 %
Lymphocytes Relative: 33 %
Lymphs Abs: 2.6 10*3/uL (ref 0.7–4.0)
MCH: 29.7 pg (ref 26.0–34.0)
MCHC: 34.6 g/dL (ref 30.0–36.0)
MCV: 85.9 fL (ref 80.0–100.0)
Monocytes Absolute: 0.5 10*3/uL (ref 0.1–1.0)
Monocytes Relative: 6 %
Neutro Abs: 4.5 10*3/uL (ref 1.7–7.7)
Neutrophils Relative %: 58 %
Platelets: 219 10*3/uL (ref 150–400)
RBC: 5.48 MIL/uL (ref 4.22–5.81)
RDW: 13.2 % (ref 11.5–15.5)
WBC: 7.8 10*3/uL (ref 4.0–10.5)
nRBC: 0 % (ref 0.0–0.2)

## 2021-03-11 LAB — COMPREHENSIVE METABOLIC PANEL
ALT: 17 U/L (ref 0–44)
AST: 17 U/L (ref 15–41)
Albumin: 4.1 g/dL (ref 3.5–5.0)
Alkaline Phosphatase: 73 U/L (ref 38–126)
Anion gap: 7 (ref 5–15)
BUN: 14 mg/dL (ref 6–20)
CO2: 27 mmol/L (ref 22–32)
Calcium: 8.8 mg/dL — ABNORMAL LOW (ref 8.9–10.3)
Chloride: 103 mmol/L (ref 98–111)
Creatinine, Ser: 0.78 mg/dL (ref 0.61–1.24)
GFR, Estimated: >60 mL/min (ref >60)
Glucose, Bld: 96 mg/dL (ref 70–99)
Potassium: 3.4 mmol/L — ABNORMAL LOW (ref 3.5–5.1)
Sodium: 137 mmol/L (ref 135–145)
Total Bilirubin: 0.5 mg/dL (ref 0.3–1.2)
Total Protein: 7.1 g/dL (ref 6.5–8.1)

## 2021-03-11 LAB — TROPONIN I (HIGH SENSITIVITY)
Troponin I (High Sensitivity): 31 ng/L — ABNORMAL HIGH (ref <18)
Troponin I (High Sensitivity): 34 ng/L — ABNORMAL HIGH (ref <18)

## 2021-03-11 MED ORDER — HYDROCHLOROTHIAZIDE 12.5 MG PO CAPS: 12.5000 mg | ORAL_CAPSULE | Freq: Every day | ORAL | 0 refills | Status: DC

## 2021-03-11 NOTE — ED Notes (Signed)
Repeat EKG performed.   

## 2021-03-11 NOTE — ED Triage Notes (Signed)
Pt reports he went to [facility] and used their machine to check his BP and it was 200/123. Pt states was recently prescribed medication for HTN last Wednesday and has been taking them. Pt denies CP, SOB or other sx's. Pt is concerned because both of his parents had HTN.

## 2021-03-11 NOTE — ED Notes (Signed)
Pt remains awake and alert; no acute distress noted. Continues to deny pain, sob, dizziness, weakness or other complaints.  EKG completed at this time -- NSR HR 69.  Will continue to monitor for acute changes and maintain plan of care

## 2021-03-11 NOTE — ED Notes (Signed)
Pt now on continuous cardiac monitoring -- current bp 186/122

## 2021-03-11 NOTE — ED Notes (Signed)
Bladder scanned: noted

## 2021-03-11 NOTE — ED Provider Notes (Signed)
[facility] Emergency Department Provider Note  ____________________________________________   Event Date/Time   First MD Initiated Contact with Patient 03/11/21 1816     (approximate)  I have reviewed the triage vital signs and the nursing notes.   HISTORY  Chief Complaint Hypertension  HPI Seth Ramirez is a 45 y.o. male who presents to the emergency department for evaluation of hypertension.  Patient was seen in our facility on Wednesday, 3/23 for evaluation of shoulder pain from a Worker's Comp. injury when he was found to have significantly elevated blood pressure.  During that visit, CBC and CMP were obtained and patient denied any chest pain, shortness of breath, headache or dizziness.  He was placed on amlodipine, 5 mg.  He states that he has been taking this medication, endorses taking it today.  He went to recheck his blood pressure at the [facility] machine and noted it to still persist elevated at 200/123.  Patient continues to deny any chest pain, shortness of breath, headaches, dizziness or blurred vision.  He states there is significant family history of hypertension, but prior to this past Wednesday he had never been diagnosed with it.  He does not have a PCP for follow-up at this time.       Past Medical History:  Diagnosis Date  . Hypertension     There are no problems to display for this patient.   Past Surgical History:  Procedure Laterality Date  . APPENDECTOMY    . COLON SURGERY      Prior to Admission medications   Medication Sig Start Date End Date Taking? Authorizing Provider  hydrochlorothiazide (MICROZIDE) 12.5 MG capsule Take 1 capsule (12.5 mg total) by mouth daily for 14 days. 03/11/21 03/25/21 Yes Rodgers, Ruben Gottron, PA  amLODipine (NORVASC) 5 MG tablet Take 1 tablet (5 mg total) by mouth daily. 03/07/21 03/07/22  Fisher, Roselyn Bering, PA-C  baclofen (LIORESAL) 10 MG tablet Take 1 tablet (10 mg total) by mouth 3 (three) times  daily for 7 days. 03/07/21 03/14/21  Fisher, Roselyn Bering, PA-C  dicyclomine (BENTYL) 10 MG capsule Take 1 capsule (10 mg total) by mouth 4 (four) times daily -  before meals and at bedtime. 06/14/15   Darien Ramus, MD  meloxicam (MOBIC) 15 MG tablet Take 1 tablet (15 mg total) by mouth daily. 03/07/21 03/07/22  Fisher, Roselyn Bering, PA-C  metoCLOPramide (REGLAN) 5 MG tablet Take 1 tablet (5 mg total) by mouth 3 (three) times daily. 06/14/15   Darien Ramus, MD    Allergies Patient has no known allergies.  No family history on file.  Social History Social History   Tobacco Use  . Smoking status: Current Every Day Smoker    Packs/day: 1.00  Substance Use Topics  . Alcohol use: No    Review of Systems Constitutional: No fever/chills Eyes: No visual changes. ENT: No sore throat. Cardiovascular: Denies chest pain. Respiratory: Denies shortness of breath. Gastrointestinal: No abdominal pain.  No nausea, no vomiting.  No diarrhea.  No constipation. Genitourinary: Negative for dysuria. Musculoskeletal: Negative for back pain. Skin: Negative for rash. Neurological: Negative for headaches, focal weakness or numbness.   ____________________________________________   PHYSICAL EXAM:  VITAL SIGNS: ED Triage Vitals  Enc Vitals Group     BP 03/11/21 1730 117/71     Pulse Rate 03/11/21 1730 92     Resp 03/11/21 1730 18     Temp 03/11/21 1730 98.6 F (37 C)  Temp Source 03/11/21 1730 Oral     SpO2 03/11/21 1730 99 %     Weight 03/11/21 1738 220 lb (99.8 kg)     Height 03/11/21 1738 5\' 10"  (1.778 m)     Head Circumference --      Peak Flow --      Pain Score 03/11/21 1738 0     Pain Loc --      Pain Edu? --      Excl. in GC? --    Constitutional: Alert and oriented. Well appearing and in no acute distress. Eyes: Conjunctivae are normal. PERRL. EOMI. Head: Atraumatic. Nose: No congestion/rhinnorhea. Mouth/Throat: Mucous membranes are moist.  Oropharynx non-erythematous. Neck:  No stridor.   Cardiovascular: Normal rate, respiratory arrhythmia noted. Grossly normal heart sounds.  Good peripheral circulation. Respiratory: Normal respiratory effort.  No retractions. Lungs CTAB. Gastrointestinal: Soft and nontender. No distention. No abdominal bruits. No CVA tenderness. Musculoskeletal: No lower extremity tenderness nor edema.  No joint effusions. Neurologic:  Normal speech and language.  Cranial nerves II through XII grossly intact.  No gross focal neurologic deficits are appreciated. No gait instability. Skin:  Skin is warm, dry and intact. No rash noted. Psychiatric: Mood and affect are normal. Speech and behavior are normal.  ____________________________________________   LABS (all labs ordered are listed, but only abnormal results are displayed)  Labs Reviewed  COMPREHENSIVE METABOLIC PANEL - Abnormal; Notable for the following components:      Result Value   Potassium 3.4 (*)    Calcium 8.8 (*)    All other components within normal limits  TROPONIN I (HIGH SENSITIVITY) - Abnormal; Notable for the following components:   Troponin I (High Sensitivity) 31 (*)    All other components within normal limits  TROPONIN I (HIGH SENSITIVITY) - Abnormal; Notable for the following components:   Troponin I (High Sensitivity) 34 (*)    All other components within normal limits  CBC WITH DIFFERENTIAL/PLATELET   ____________________________________________  EKG  Initial EKG with normal sinus rhythm at 69 bpm, no ST elevations or depressions, no evidence of acute ischemia.  Repeat EKG was performed several hours later during an episode of abnormality on the heart monitor.  Patient was not reporting symptoms at that time either, repeat EKG reveals normal sinus rhythm with a rate of 65 bpm.  There are mild changes in lead I, but no evidence of any ST elevations or depressions. ____________________________________________  RADIOLOGY 03/13/21, personally  viewed and evaluated these images (plain radiographs) as part of my medical decision making, as well as reviewing the written report by the radiologist.  ED provider interpretation: Chest x-ray is negative for any acute process  Official radiology report(s): DG Chest 2 View  Result Date: 03/11/2021 CLINICAL DATA:  Hypertension EXAM: CHEST - 2 VIEW COMPARISON:  None. FINDINGS: The heart size and mediastinal contours are within normal limits. Both lungs are clear. The visualized skeletal structures are unremarkable. IMPRESSION: No active cardiopulmonary disease. Electronically Signed   By: 03/13/2021 D.O.   On: 03/11/2021 20:10    ____________________________________________   INITIAL IMPRESSION / ASSESSMENT AND PLAN / ED COURSE  As part of my medical decision making, I reviewed the following data within the electronic MEDICAL RECORD NUMBER Nursing notes reviewed and incorporated, Labs reviewed, Radiograph reviewed, Evaluated by EM attending Dr. 03/13/2021 and Notes from prior ED visits        Patient is a 45 year old male who presents to the emergency department for  evaluation of hypertension after initially starting amlodipine 5 mg from this facility 5 days ago.  He denies any associated symptoms.  See HPI for further details.  In triage, the patient is normotensive with a blood pressure of 117/71, other vitals are also within normal limits.  On physical exam, the patient does have an abnormal auscultation with perceived unclear arrhythmia.  No murmurs or gallops.  Remainder of physical exam is grossly within normal limits.  Laboratory evaluation was obtained including CBC, CMP, troponin.  Troponin was mildly elevated at 31.  EKG was performed without any evidence of ST elevation or depression or other acute changes.  X-rays negative for acute process.  Following laboratory evaluation.  Patient was on the heart monitor and had what initially appeared to be an episode of V. tach, however patient  reports that he was moving in the bed and this is likely artifact.  He continues to deny symptoms, however repeat EKG was performed with small changes compared to previous.  At this time, Dr. Fuller Plan personally came and evaluated the patient as well.  Repeat troponin is largely flat with bump only to 34.  Repeat pressures have all been elevated, making the initial blood pressure likely an anomaly.  Elevated troponin is likely due to demand ischemia with no evidence of NSTEMI at this time.  Per the recommendation of Dr. Fuller Plan, did send the patient a short course of HCTZ to add on to his regimen in a few days if his pressures were not improving at home.  Patient was counseled on this medication and is amenable with plan.  He was encouraged to establish with a PCP as well as a referral to cardiology was placed given his elevated troponin and blood pressure.  Patient is stable at this time for outpatient follow-up.  Return precautions were discussed at length.  Clinical Course as of 03/12/21 1710  Sun Mar 11, 2021  2253 BP(!): 175/113 [MF]    Clinical Course User Index [MF] Concha Se, MD     ____________________________________________   FINAL CLINICAL IMPRESSION(S) / ED DIAGNOSES  Final diagnoses:  Hypertension, unspecified type  Elevated troponin     ED Discharge Orders         Ordered    hydrochlorothiazide (MICROZIDE) 12.5 MG capsule  Daily        03/11/21 2304          *Please note:  JIANNI BATTEN was evaluated in Emergency Department on 03/12/2021 for the symptoms described in the history of present illness. He was evaluated in the context of the global COVID-19 pandemic, which necessitated consideration that the patient might be at risk for infection with the SARS-CoV-2 virus that causes COVID-19. Institutional protocols and algorithms that pertain to the evaluation of patients at risk for COVID-19 are in a state of rapid change based on information released by regulatory bodies  including the CDC and federal and state organizations. These policies and algorithms were followed during the patient's care in the ED.  Some ED evaluations and interventions may be delayed as a result of limited staffing during and the pandemic.*   Note:  This document was prepared using Dragon voice recognition software and may include unintentional dictation errors.   Lucy Chris, PA 03/12/21 1715    Concha Se, MD 03/14/21 [phone]

## 2021-03-11 NOTE — Discharge Instructions (Addendum)
Please continue your amlodipine as previously prescribed. You have been written for HCTZ to begin taking if blood pressure is still elevated in a few additional days. Please follow up with a primary care doctor. You have also been referred to a cardiologist.  Return to ER with any chest pain, shortness of breath, dizziness or other acute changes.

## 2021-03-15 ENCOUNTER — Telehealth: Payer: Self-pay

## 2021-03-15 NOTE — Telephone Encounter (Signed)
After receiving an ER referral, called pt and LVM.

## 2021-03-30 IMAGING — CR DG CHEST 2V
3 series · 3 of 3 positions shown · non-contrast
Comparison: None.

CLINICAL DATA: Hypertension

EXAM:
CHEST - 2 VIEW

[chest pa]
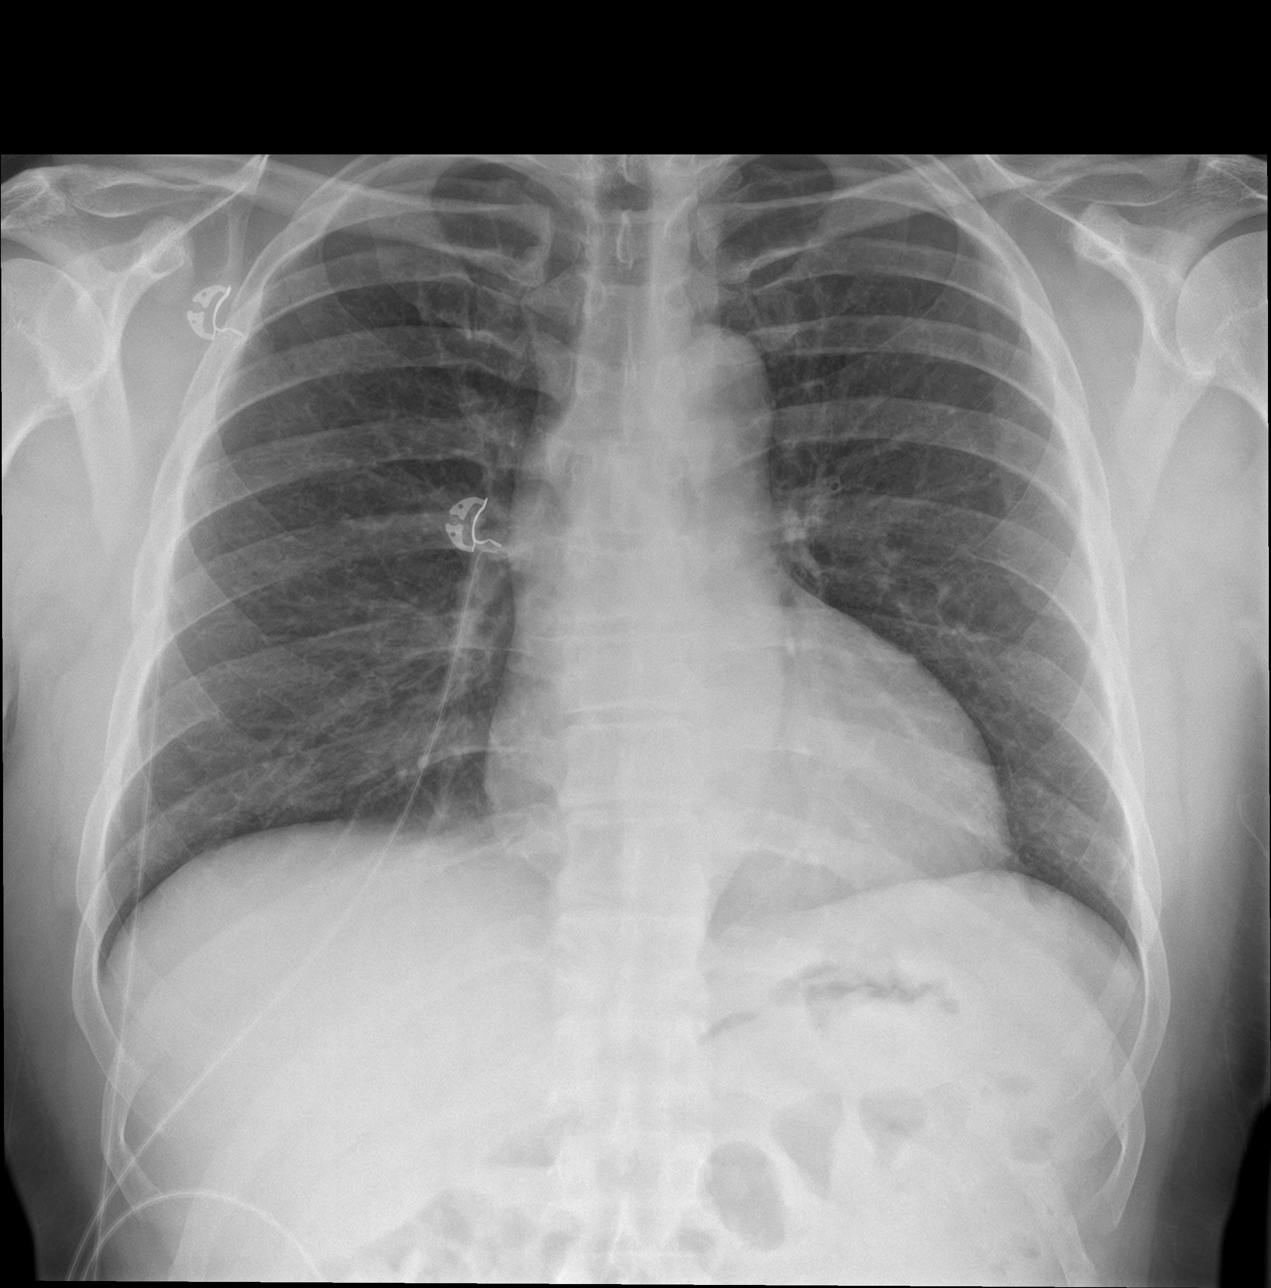

[chest lat (1 of 2)]
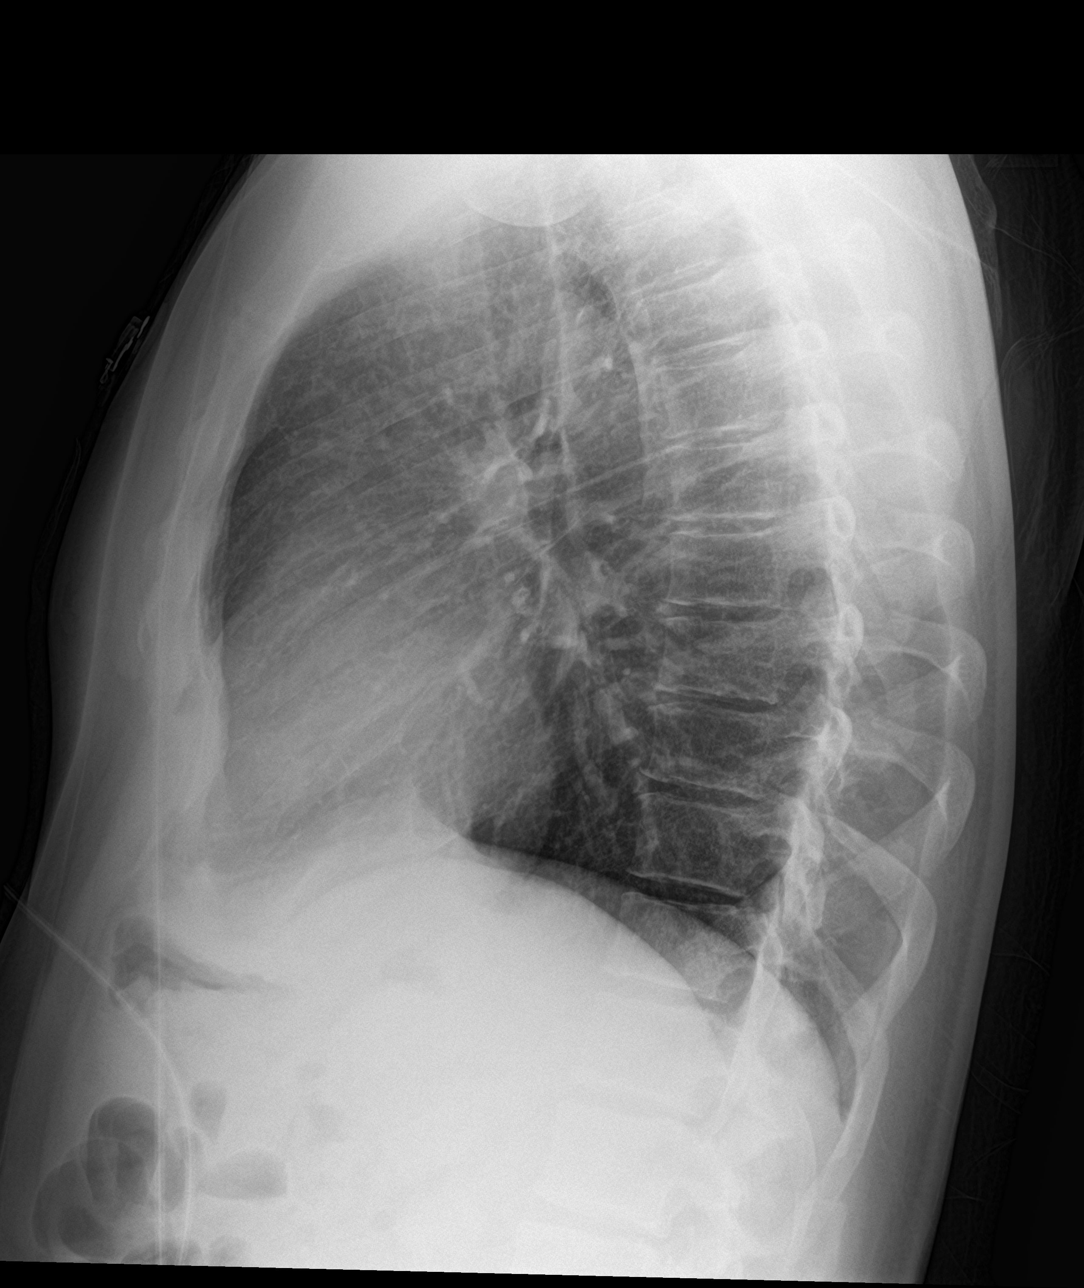

[chest lat (2 of 2)]
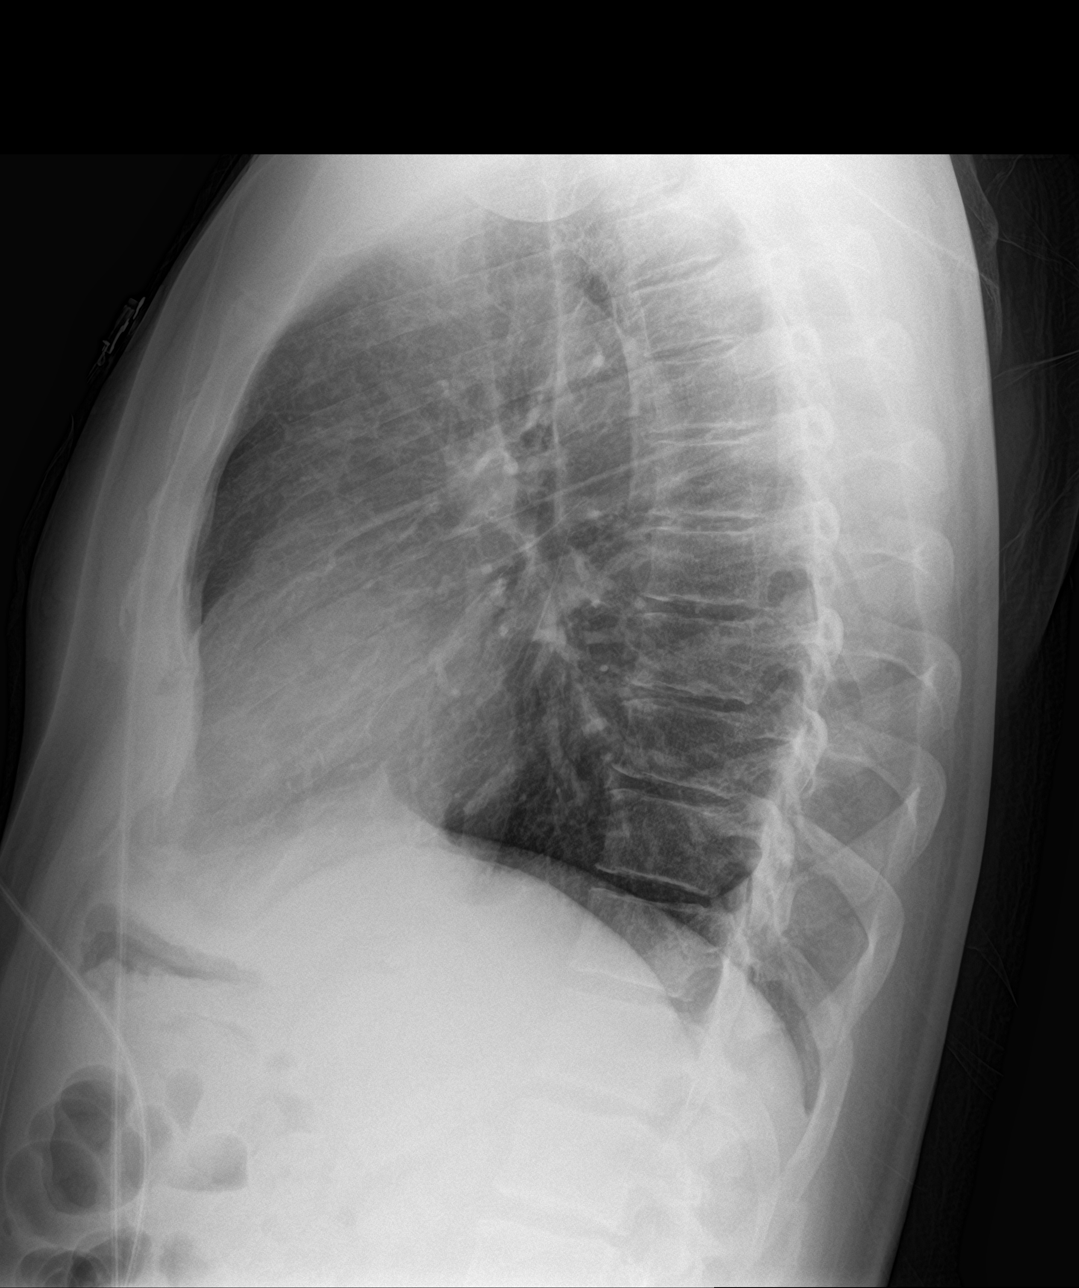

[3 of 3 positions shown; findings below may reference images not displayed]

FINDINGS: The heart size and mediastinal contours are within normal limits.
Both lungs are clear. The visualized skeletal structures are
unremarkable.
IMPRESSION: No active cardiopulmonary disease.

## 2022-10-01 ENCOUNTER — Other Ambulatory Visit: Payer: Self-pay

## 2022-10-01 ENCOUNTER — Emergency Department
Admission: EM | Admit: 2022-10-01 | Discharge: 2022-10-01 | Disposition: A | Discharge status: Home / Self Care | Payer: Self-pay | Attending: Student in an Organized Health Care Education/Training Program | Admitting: Student in an Organized Health Care Education/Training Program

## 2022-10-01 ENCOUNTER — Emergency Department: Payer: Self-pay

## 2022-10-01 DIAGNOSIS — I10 Essential (primary) hypertension: Secondary | ICD-10-CM | POA: Insufficient documentation

## 2022-10-01 LAB — COMPREHENSIVE METABOLIC PANEL
ALT: 10 U/L (ref 0–44)
AST: 17 U/L (ref 15–41)
Albumin: 4.4 g/dL (ref 3.5–5.0)
Alkaline Phosphatase: 73 U/L (ref 38–126)
Anion gap: 8 (ref 5–15)
BUN: 9 mg/dL (ref 6–20)
CO2: 28 mmol/L (ref 22–32)
Calcium: 9.4 mg/dL (ref 8.9–10.3)
Chloride: 102 mmol/L (ref 98–111)
Creatinine, Ser: 0.98 mg/dL (ref 0.61–1.24)
GFR, Estimated: >60 mL/min (ref >60)
Glucose, Bld: 101 mg/dL — ABNORMAL HIGH (ref 70–99)
Potassium: 2.9 mmol/L — ABNORMAL LOW (ref 3.5–5.1)
Sodium: 138 mmol/L (ref 135–145)
Total Bilirubin: 1 mg/dL (ref 0.3–1.2)
Total Protein: 7.5 g/dL (ref 6.5–8.1)

## 2022-10-01 LAB — TROPONIN I (HIGH SENSITIVITY)
Troponin I (High Sensitivity): 42 ng/L — ABNORMAL HIGH (ref <18)
Troponin I (High Sensitivity): 47 ng/L — ABNORMAL HIGH (ref <18)

## 2022-10-01 LAB — CBC
HCT: 48.1 % (ref 39.0–52.0)
Hemoglobin: 16.5 g/dL (ref 13.0–17.0)
MCH: 29.7 pg (ref 26.0–34.0)
MCHC: 34.3 g/dL (ref 30.0–36.0)
MCV: 86.5 fL (ref 80.0–100.0)
Platelets: 197 10*3/uL (ref 150–400)
RBC: 5.56 MIL/uL (ref 4.22–5.81)
RDW: 13.5 % (ref 11.5–15.5)
WBC: 6 10*3/uL (ref 4.0–10.5)
nRBC: 0 % (ref 0.0–0.2)

## 2022-10-01 MED ORDER — AMLODIPINE BESYLATE 5 MG PO TABS: 10.0000 mg | ORAL_TABLET | Freq: Every day | ORAL | 1 refills | Status: DC

## 2022-10-01 MED ORDER — AMLODIPINE BESYLATE 5 MG PO TABS: 5.0000 mg | ORAL_TABLET | Freq: Every day | ORAL | 11 refills | Status: DC

## 2022-10-01 MED ORDER — AMLODIPINE BESYLATE 5 MG PO TABS
5.0000 mg | ORAL_TABLET | Freq: Once | ORAL | Status: AC
  Administered 2022-10-01: 5 mg via ORAL
  Filled 2022-10-01: qty 1

## 2022-10-01 MED ORDER — POTASSIUM CHLORIDE CRYS ER 20 MEQ PO TBCR
40.0000 meq | EXTENDED_RELEASE_TABLET | Freq: Once | ORAL | Status: AC
Start: 2022-10-01 — End: 2022-10-01
  Administered 2022-10-01: 40 meq via ORAL
  Filled 2022-10-01: qty 2

## 2022-10-01 NOTE — ED Notes (Signed)
First nurse note: Pt presents with hypertension via EMS HX of same, has been out of his meds for the past month.

## 2022-10-01 NOTE — ED Provider Notes (Signed)
[facility] Provider Note    Event Date/Time   First MD Initiated Contact with Patient 10/01/22 [phone]     (approximate)   History   Hypertension   HPI  Seth Ramirez is a 46 y.o. male with a history of high blood pressure presents to the ER for evaluation of elevated blood pressure.  Had episode at work today where he is got frustrated for a few moments little lightheaded denies any weakness no numbness or tingling denies any headache.  No neck stiffness.  States he is supposed to be on blood pressure medication but is not taking it more than 6 months.  He denies any chest pain or pressure.  No recent falls.     Physical Exam   Triage Vital Signs: ED Triage Vitals  Enc Vitals Group     BP 10/01/22 0910 (!) 189/118     Pulse Rate 10/01/22 0910 73     Resp 10/01/22 0910 18     Temp 10/01/22 0910 98.5 F (36.9 C)     Temp Source 10/01/22 0910 Oral     SpO2 10/01/22 0910 98 %     Weight 10/01/22 1006 220 lb 0.3 oz (99.8 kg)     Height 10/01/22 1006 5\' 10"  (1.778 m)     Head Circumference --      Peak Flow --      Pain Score --      Pain Loc --      Pain Edu? --      Excl. in GC? --     Most recent vital signs: Vitals:   10/01/22 0910 10/01/22 1257  BP: (!) 189/118 (!) 172/114  Pulse: 73 70  Resp: 18 18  Temp: 98.5 F (36.9 C) 98 F (36.7 C)  SpO2: 98% 98%     Constitutional: Alert  Eyes: Conjunctivae are normal.  Head: Atraumatic. Nose: No congestion/rhinnorhea. Mouth/Throat: Mucous membranes are moist.   Neck: Painless ROM.  Cardiovascular:   Good peripheral circulation. Respiratory: Normal respiratory effort.  No retractions.  Gastrointestinal: Soft and nontender.  Musculoskeletal:  no deformity Neurologic:  CN- intact.  No facial droop, Normal FNF.  Normal heel to shin.  Sensation intact bilaterally. Normal speech and language. No gross focal neurologic deficits are appreciated. No gait instability.  Skin:  Skin is warm, dry  and intact. No rash noted. Psychiatric: Mood and affect are normal. Speech and behavior are normal.    ED Results / Procedures / Treatments   Labs (all labs ordered are listed, but only abnormal results are displayed) Labs Reviewed  COMPREHENSIVE METABOLIC PANEL - Abnormal; Notable for the following components:      Result Value   Potassium 2.9 (*)    Glucose, Bld 101 (*)    All other components within normal limits  TROPONIN I (HIGH SENSITIVITY) - Abnormal; Notable for the following components:   Troponin I (High Sensitivity) 42 (*)    All other components within normal limits  TROPONIN I (HIGH SENSITIVITY) - Abnormal; Notable for the following components:   Troponin I (High Sensitivity) 47 (*)    All other components within normal limits  CBC     EKG  ED ECG REPORT I, 10/03/22, the attending physician, personally viewed and interpreted this ECG.   Date: 10/01/2022  EKG Time: 9:20  Rate: 70  Rhythm: sinus  Axis: normal  Intervals: normal qt  ST&T Change: nonspecific st and t wave abn, similar morphology to previous  RADIOLOGY Please see ED Course for my review and interpretation.  I personally reviewed all radiographic images ordered to evaluate for the above acute complaints and reviewed radiology reports and findings.  These findings were personally discussed with the patient.  Please see medical record for radiology report.    PROCEDURES:  Critical Care performed:   Procedures   MEDICATIONS ORDERED IN ED: Medications  amLODipine (NORVASC) tablet 5 mg (5 mg Oral Given 10/01/22 1022)  potassium chloride SA (KLOR-CON M) CR tablet 40 mEq (40 mEq Oral Given 10/01/22 1022)     IMPRESSION / MDM / ASSESSMENT AND PLAN / ED COURSE  I reviewed the triage vital signs and the nursing notes.                              Differential diagnosis includes, but is not limited to, hypertension, electrolyte abnormality, ACS, CHF, CVA, SAH  Patient  presenting to the ER for evaluation of symptoms as described above.  Based on symptoms, risk factors and considered above differential, this presenting complaint could reflect a potentially life-threatening illness therefore the patient will be placed on continuous pulse oximetry and telemetry for monitoring.  Laboratory evaluation will be sent to evaluate for the above complaints.  Patient very well-appearing clinically stable reassuring exam and asymptomatic.  EKG appears nonischemic.  Patient with significantly elevated blood pressure denies any headaches no numbness or tingling.  Not consistent with CVA or SAH.  Will be given antihypertensive medication will observe check blood work.   Clinical Course as of 10/01/22 1303  Tue Oct 01, 2022  1038 Chest x-ray on my review and interpretation does not show any evidence of infiltrate or consolidation. [PR]  6301 Patient remains well-appearing in no acute distress.  Has room to increase blood pressure medication.  He remains asymptomatic.  I think this is longstanding hypertension which corresponds with his stable slightly elevated troponin.  Not consistent with ACS.  Will be given referral to cardiology as well as PCP.  Discussed return precautions. [PR]    Clinical Course User Index [PR] Merlyn Lot, MD     FINAL CLINICAL IMPRESSION(S) / ED DIAGNOSES   Final diagnoses:  Hypertension, unspecified type     Rx / DC Orders   ED Discharge Orders          Ordered    amLODipine (NORVASC) 5 MG tablet  Daily        10/01/22 1058    amLODipine (NORVASC) 5 MG tablet  Daily        10/01/22 1259    Ambulatory referral to Cardiology       Comments: If you have not heard from the Cardiology office within the next 72 hours please call [phone].   10/01/22 1300             Note:  This document was prepared using Dragon voice recognition software and may include unintentional dictation errors.    Merlyn Lot, MD 10/01/22  [phone]

## 2022-10-17 ENCOUNTER — Encounter: Payer: Self-pay | Admitting: Internal Medicine

## 2022-10-17 ENCOUNTER — Ambulatory Visit: Payer: Self-pay | Attending: Internal Medicine | Admitting: Internal Medicine

## 2022-10-17 VITALS — BP 148/96 | HR 69 | Ht 70.0 in | Wt 190.2 lb

## 2022-10-17 DIAGNOSIS — R0683 Snoring: Secondary | ICD-10-CM

## 2022-10-17 DIAGNOSIS — I10 Essential (primary) hypertension: Secondary | ICD-10-CM

## 2022-10-17 DIAGNOSIS — R011 Cardiac murmur, unspecified: Secondary | ICD-10-CM

## 2022-10-17 MED ORDER — AMLODIPINE BESYLATE 10 MG PO TABS
10.0000 mg | ORAL_TABLET | Freq: Every day | ORAL | 3 refills | Status: AC
Start: 2022-10-17 — End: ?

## 2022-10-17 MED ORDER — HYDROCHLOROTHIAZIDE 25 MG PO TABS: 25.0000 mg | ORAL_TABLET | Freq: Every day | ORAL | 3 refills | Status: AC

## 2022-10-17 NOTE — Progress Notes (Signed)
Cardiology Office Note:    Date:  10/17/2022   ID:  Charlott Rakes, DOB [birth year], MRN [id number]  PCP:  Patient, No Pcp Per   [place] Providers Cardiologist:  None     Referring MD: Merlyn Lot, MD   No chief complaint on file. HTN  History of Present Illness:    Seth Ramirez is a 46 y.o. male with a hx of HTN, blood pressure was 189/118 mmHg in the ED, prior was 220/190 mmHg prior. He was prescribed norvasc 5 mg daily. ECG sinus rhythm with PACs, LVH with repol.  Blood pressure today 148/96 mmHg.   He smokes cigarettes. He snores at night. He is taking norvasc 10 mg daily.  Father had MI. Mother no heart disease. No prior cardiac studies.     Past Medical History:  Diagnosis Date   Hypertension     Past Surgical History:  Procedure Laterality Date   APPENDECTOMY     COLON SURGERY      Current Medications: Current Meds  Medication Sig   amLODipine (NORVASC) 5 MG tablet Take 2 tablets (10 mg total) by mouth daily.     Allergies:   Patient has no known allergies.   Social History   Socioeconomic History   Marital status: Married    Spouse name: Not on file   Number of children: Not on file   Years of education: Not on file   Highest education level: Not on file  Occupational History   Not on file  Tobacco Use   Smoking status: Every Day    Packs/day: 1.00    Types: Cigarettes   Smokeless tobacco: Not on file  Substance and Sexual Activity   Alcohol use: No   Drug use: Not on file   Sexual activity: Not on file  Other Topics Concern   Not on file  Social History Narrative   Not on file   Social Determinants of Health   Financial Resource Strain: Not on file  Food Insecurity: Not on file  Transportation Needs: Not on file  Physical Activity: Not on file  Stress: Not on file  Social Connections: Not on file     Family History: Father had MI.   ROS:   Please see the history of present illness.     All other systems  reviewed and are negative.  EKGs/Labs/Other Studies Reviewed:    The following studies were reviewed today:   EKG:  EKG is  ordered today.  The ekg ordered today demonstrates   11/2- NSR, LVH with repol  Recent Labs: 10/01/2022: ALT 10; BUN 9; Creatinine, Ser 0.98; Hemoglobin 16.5; Platelets 197; Potassium 2.9; Sodium 138  Recent Lipid Panel No results found for: "CHOL", "TRIG", "HDL", "CHOLHDL", "VLDL", "LDLCALC", "LDLDIRECT"   Risk Assessment/Calculations:     Physical Exam:    VS:  BP (!) 148/96 (BP Location: Left Arm, Patient Position: Sitting, Cuff Size: Large)   Pulse 69   Ht 5\' 10"  (1.778 m)   Wt 190 lb 3.2 oz (86.3 kg)   SpO2 99%   BMI 27.29 kg/m     Wt Readings from Last 3 Encounters:  10/17/22 190 lb 3.2 oz (86.3 kg)  10/01/22 220 lb 0.3 oz (99.8 kg)  03/11/21 220 lb (99.8 kg)     GEN:  Well nourished, well developed in no acute distress HEENT: Normal NECK: No JVD; No carotid bruits LYMPHATICS: No lymphadenopathy CARDIAC: RRR, no murmurs, rubs, gallops RESPIRATORY:  Clear to auscultation  without rales, wheezing or rhonchi  ABDOMEN: Soft, non-tender, non-distended MUSCULOSKELETAL:  No edema; No deformity  SKIN: Warm and dry NEUROLOGIC:  Alert and oriented x 3 PSYCHIATRIC:  Normal affect   ASSESSMENT:    HTN: poorly controlled. Snoring (STOP-BANG=5) and smoking - continue norvasc 10 mg daily - HCTz 25 mg daily - smoking cessation counseling provided   SEM: possibly AS; will get an echo  PLAN:    In order of problems listed above:  Sleep study referral/CPAP Norvasc 10 mg daily TTE HCTz 25 mg daily HTN pharmacy in 3 months Follow up 6 months        Medication Adjustments/Labs and Tests Ordered: Current medicines are reviewed at length with the patient today.  Concerns regarding medicines are outlined above.  No orders of the defined types were placed in this encounter.  No orders of the defined types were placed in this  encounter.   Patient Instructions  Please check blood pressures for one week. If they are on average > 130/80 mmHg then let us know     Signed, Maisie Fus, MD  10/17/2022 4:09 PM    [facility]

## 2022-10-17 NOTE — Patient Instructions (Signed)
Please check blood pressures for one week. If they are on average > 130/80 mmHg then let us know   Medication Instructions:  Your physician has recommended you make the following change in your medication:  INCREASE: Amlodipine 10mg  daily (1 tablet)  INCREASE: Hydrochlorothiazide 25mg  daily (1 tablet)   *If you need a refill on your cardiac medications before your next appointment, please call your pharmacy*   Lab Work: NONE If you have labs (blood work) drawn today and your tests are completely normal, you will receive your results only by: [place] (if you have MyChart) OR A paper copy in the mail If you have any lab test that is abnormal or we need to change your treatment, we will call you to review the results.   Testing/Procedures: Your physician has requested that you have an echocardiogram. Echocardiography is a painless test that uses sound waves to create images of your heart. It provides your doctor with information about the size and shape of your heart and how well your heart's chambers and valves are working. This procedure takes approximately one hour. There are no restrictions for this procedure. Please do NOT wear cologne, perfume, aftershave, or lotions (deodorant is allowed). Please arrive 15 minutes prior to your appointment time.    Follow-Up: At [facility], you and your health needs are our priority.  As part of our continuing mission to provide you with exceptional heart care, we have created designated Provider Care Teams.  These Care Teams include your primary Cardiologist (physician) and Advanced Practice Providers (APPs -  Physician Assistants and Nurse Practitioners) who all work together to provide you with the care you need, when you need it.  We recommend signing up for the patient portal called "MyChart".  Sign up information is provided on this After Visit Summary.  MyChart is used to connect with patients for Virtual Visits (Telemedicine).   Patients are able to view lab/test results, encounter notes, upcoming appointments, etc.  Non-urgent messages can be sent to your provider as well.   To learn more about what you can do with MyChart, go to [email or web address].    Your next appointment:   6 month(s)  The format for your next appointment:   In Person  Provider:   Janina Mayo, MD

## 2022-10-18 ENCOUNTER — Telehealth: Payer: Self-pay | Admitting: Licensed Clinical Social Worker

## 2022-10-18 NOTE — Progress Notes (Signed)
Heart and Vascular Care Navigation  10/18/2022  Seth Ramirez 1976/08/03 [id number]  Reason for Referral:  Patient is participating in a Managed Medicaid Plan: No, self pay only.  Engaged with patient by telephone for initial visit for Heart and Vascular Care Coordination.                                                                                                   Assessment:                 LCSW was able to reach pt this morning at [phone]. Introduced self, role, reason for call. Pt confirmed home address, no current PCP or insurance. He lives with his wife and two kids, he and his wife currently work- he does share that he thinks he may not have employment much longer. I provided support for the stress that must bring. He is unsure if his wife or his kids have insurance- or if they have Medicaid. He does not think he has ever applied for Medicaid. He is interested in applying for assistance, likely will speak with his wife about what they would like to pursue.   Other than medical costs pt shares that they pay late on bills but havent had any turn off notices or eviction notices. I shared if they feel they could use assistance before it got to one of those situations to reach out to me so we can provide support or resources to make ends meet.   No additional questions/concerns shared with this Seth Ramirez at this time.                    HRT/VAS Care Coordination     Patients Home Cardiology Office [facility]   Outpatient Care Team Social Worker   Social Worker Name: Seth Ramirez, [facility] [phone]   Living arrangements for the past 2 months Single Family Home   Lives with: Spouse; Minor Children   Patient Current Insurance Coverage Self-Pay   Patient Has Concern With Paying Medical Bills Yes   Patient Concerns With Medical Bills no coverage, ongoing medical work up   Medical Bill Referrals: [employer];  [facility]   Does Patient Have Prescription Coverage? No   Patient Prescription Assistance Programs Other   Other Assistance Programs Medications offered assistance as needed       Social History:                                                                             SDOH Screenings   Food Insecurity: No Food Insecurity (10/18/2022)  Housing: Low Risk  (10/18/2022)  Transportation Needs: No Transportation Needs (10/18/2022)  Utilities: Not At Risk (10/18/2022)  Financial Resource Strain: Medium Risk (10/18/2022)  Tobacco Use: High Risk (  10/17/2022)    SDOH Interventions: Financial Resources:  Financial Strain Interventions: Other (Comment) Seth biochemist; [facility]; [employer] card Second Social worker) Occupational hygienist for [place] Insecurity:  Food Insecurity Interventions: Other (Comment) (pt mentioned general financial stress will send SNAP letter from [employer])  Housing Insecurity:  Housing Interventions: Other (Comment) (pt mentioned general financial stress, encouraged him to call if we could assist with any specific costs)  Transportation:   Transportation Interventions: Intervention Not Indicated    Other Care Navigation Interventions:     Provided Pharmacy assistance resources Other   Follow-up plan:   LCSW will mail pt the [employer]  application, [facility] applications, Medicaid expansion flyer and open enrollment flyer to pt along with my card and the [place] SNAP card. I will f/u to ensure received.

## 2022-10-21 ENCOUNTER — Encounter: Payer: Self-pay | Admitting: Internal Medicine

## 2022-10-21 ENCOUNTER — Telehealth: Payer: Self-pay | Admitting: Internal Medicine

## 2022-10-21 NOTE — Telephone Encounter (Signed)
*  STAT* If patient is at the pharmacy, call can be transferred to refill team.   1. Which medications need to be refilled? (please list name of each medication and dose if known) dicyclomine (BENTYL) 10 MG capsule metoCLOPramide (REGLAN) 5 MG tablet  2. Which pharmacy/location (including street and city if local pharmacy) is medication to be sent to?  [place] ([facility]), [place] - [address]    3. Do they need a 30 day or 90 day supply? [place]

## 2022-10-24 ENCOUNTER — Telehealth: Payer: Self-pay | Admitting: Licensed Clinical Social Worker

## 2022-10-24 NOTE — Telephone Encounter (Signed)
H&V Care Navigation CSW Progress Note  Clinical Social Worker contacted patient by phone to f/u on assistance applications and information for community clinics mailed to pt. No answer at [phone]. Left voicemail; will re-attempt as able.   Patient is participating in a Managed Medicaid Plan:  No, self pay only.   SDOH Screenings   Food Insecurity: No Food Insecurity (10/18/2022)  Housing: Low Risk  (10/18/2022)  Transportation Needs: No Transportation Needs (10/18/2022)  Utilities: Not At Risk (10/18/2022)  Financial Resource Strain: Medium Risk (10/18/2022)  Tobacco Use: High Risk (10/17/2022)   Octavio Graves, MSW, LCSW Clinical Social Worker II [facility] Heart/Vascular Care Navigation  [phone]- work cell phone (preferred) [phone]- desk phone

## 2022-10-29 ENCOUNTER — Emergency Department
Admission: EM | Admit: 2022-10-29 | Discharge: 2022-10-29 | Disposition: A | Discharge status: Home / Self Care | Payer: Self-pay | Attending: Emergency Medicine | Admitting: Emergency Medicine

## 2022-10-29 ENCOUNTER — Encounter: Payer: Self-pay | Admitting: Emergency Medicine

## 2022-10-29 ENCOUNTER — Other Ambulatory Visit: Payer: Self-pay

## 2022-10-29 DIAGNOSIS — E876 Hypokalemia: Secondary | ICD-10-CM | POA: Insufficient documentation

## 2022-10-29 DIAGNOSIS — R778 Other specified abnormalities of plasma proteins: Secondary | ICD-10-CM | POA: Insufficient documentation

## 2022-10-29 DIAGNOSIS — R55 Syncope and collapse: Secondary | ICD-10-CM | POA: Insufficient documentation

## 2022-10-29 LAB — CBC
HCT: 44.3 % (ref 39.0–52.0)
Hemoglobin: 15.9 g/dL (ref 13.0–17.0)
MCH: 29.2 pg (ref 26.0–34.0)
MCHC: 35.9 g/dL (ref 30.0–36.0)
MCV: 81.4 fL (ref 80.0–100.0)
Platelets: 198 10*3/uL (ref 150–400)
RBC: 5.44 MIL/uL (ref 4.22–5.81)
RDW: 12.4 % (ref 11.5–15.5)
WBC: 6.3 10*3/uL (ref 4.0–10.5)
nRBC: 0 % (ref 0.0–0.2)

## 2022-10-29 LAB — COMPREHENSIVE METABOLIC PANEL
ALT: 14 U/L (ref 0–44)
AST: 19 U/L (ref 15–41)
Albumin: 4.1 g/dL (ref 3.5–5.0)
Alkaline Phosphatase: 94 U/L (ref 38–126)
Anion gap: 10 (ref 5–15)
BUN: 9 mg/dL (ref 6–20)
CO2: 32 mmol/L (ref 22–32)
Calcium: 9 mg/dL (ref 8.9–10.3)
Chloride: 88 mmol/L — ABNORMAL LOW (ref 98–111)
Creatinine, Ser: 1.05 mg/dL (ref 0.61–1.24)
GFR, Estimated: >60 mL/min (ref >60)
Glucose, Bld: 105 mg/dL — ABNORMAL HIGH (ref 70–99)
Potassium: 2.5 mmol/L — CL (ref 3.5–5.1)
Sodium: 130 mmol/L — ABNORMAL LOW (ref 135–145)
Total Bilirubin: 1 mg/dL (ref 0.3–1.2)
Total Protein: 7.6 g/dL (ref 6.5–8.1)

## 2022-10-29 LAB — TROPONIN I (HIGH SENSITIVITY): Troponin I (High Sensitivity): 41 ng/L — ABNORMAL HIGH (ref <18)

## 2022-10-29 MED ORDER — POTASSIUM CHLORIDE CRYS ER 20 MEQ PO TBCR
40.0000 meq | EXTENDED_RELEASE_TABLET | Freq: Once | ORAL | Status: AC
  Administered 2022-10-29: 40 meq via ORAL
  Filled 2022-10-29: qty 2

## 2022-10-29 MED ORDER — POTASSIUM CHLORIDE CRYS ER 20 MEQ PO TBCR: 20.0000 meq | EXTENDED_RELEASE_TABLET | Freq: Two times a day (BID) | ORAL | 0 refills | Status: AC

## 2022-10-29 MED ORDER — SODIUM CHLORIDE 0.9 % IV SOLN: Freq: Once | INTRAVENOUS | Status: AC

## 2022-10-29 NOTE — ED Provider Notes (Signed)
[facility] Provider Note    Event Date/Time   First MD Initiated Contact with Patient 10/29/22 [phone]     (approximate)   History   Near Syncope   HPI  Seth Ramirez is a 46 y.o. male with history of high blood pressure who presents after a near syncopal episode today at work.  He had been standing for a period of time, started to feel lightheaded and saw spots so sat down.  He reports he passed out about a week ago also at work.  He does report that he slept poorly last night.  He denies chest pain or palpitations.  No history of arrhythmia.  No nausea or vomiting or abdominal pain.     Physical Exam   Triage Vital Signs: ED Triage Vitals  Enc Vitals Group     BP 10/29/22 0757 (!) 150/97     Pulse Rate 10/29/22 0757 79     Resp 10/29/22 0757 18     Temp 10/29/22 0757 (!) 97.5 F (36.4 C)     Temp Source 10/29/22 0757 Oral     SpO2 10/29/22 0757 99 %     Weight 10/29/22 0758 81.6 kg (180 lb)     Height 10/29/22 0758 1.778 m (5\' 10" )     Head Circumference --      Peak Flow --      Pain Score 10/29/22 0758 5     Pain Loc --      Pain Edu? --      Excl. in GC? --     Most recent vital signs: Vitals:   10/29/22 0757  BP: (!) 150/97  Pulse: 79  Resp: 18  Temp: (!) 97.5 F (36.4 C)  SpO2: 99%     General: Awake, no distress.  CV:  Good peripheral perfusion.  3 out of 6 systolic ejection murmur Resp:  Normal effort.  Abd:  No distention.  Other:     ED Results / Procedures / Treatments   Labs (all labs ordered are listed, but only abnormal results are displayed) Labs Reviewed  COMPREHENSIVE METABOLIC PANEL - Abnormal; Notable for the following components:      Result Value   Sodium 130 (*)    Potassium 2.5 (*)    Chloride 88 (*)    Glucose, Bld 105 (*)    All other components within normal limits  TROPONIN I (HIGH SENSITIVITY) - Abnormal; Notable for the following components:   Troponin I (High Sensitivity) 41 (*)    All  other components within normal limits  CBC     EKG  ED ECG REPORT I, 10/31/22, the attending physician, personally viewed and interpreted this ECG.  Date: 10/29/2022  Rhythm: normal sinus rhythm QRS Axis: normal Intervals: Abnormal ST/T Wave abnormalities: Nonspecific changes Narrative Interpretation: No change from prior EKG    RADIOLOGY     PROCEDURES:  Critical Care performed:   Procedures   MEDICATIONS ORDERED IN ED: Medications  0.9 %  sodium chloride infusion ( Intravenous New Bag/Given 10/29/22 0913)  potassium chloride SA (KLOR-CON M) CR tablet 40 mEq (40 mEq Oral Given 10/29/22 0958)     IMPRESSION / MDM / ASSESSMENT AND PLAN / ED COURSE  I reviewed the triage vital signs and the nursing notes. Patient's presentation is most consistent with acute presentation with potential threat to life or bodily function.  Patient presents after a near syncopal episode.  Differential includes dehydration, vasovagal episode, electrolyte abnormality,  arrhythmia  EKG is unchanged from prior, no chest pain to suggest ACS, pending labs including high-sensitivity troponin.  Notably the patient does have a chronically elevated troponin in the 40s  On exam he has a systolic murmur, I have emphasized to him that he needs cardiology follow-up for further evaluation  Lab work notable for mild hypokalemia, patient given K. Dur.  On reexam he is ambulating around the room impatiently.  He feels well, no dizziness.  Appropriate for discharge at this time, considered admission for hypokalemia but he would like to try p.o. potassium first.  Strongly urged him to follow-up with cardiology which she states that he will do      FINAL CLINICAL IMPRESSION(S) / ED DIAGNOSES   Final diagnoses:  Near syncope  Hypokalemia     Rx / DC Orders   ED Discharge Orders          Ordered    potassium chloride SA (KLOR-CON M) 20 MEQ tablet  2 times daily        10/29/22 1005              Note:  This document was prepared using Dragon voice recognition software and may include unintentional dictation errors.   Jene Every, MD 10/29/22 1011

## 2022-10-29 NOTE — ED Triage Notes (Signed)
First nurse note: pt pov here after becoming lightheaded and seeing spots at work. Pt a/o, ambulating in lobby independently.

## 2022-10-30 ENCOUNTER — Telehealth: Payer: Self-pay | Admitting: Internal Medicine

## 2022-10-30 NOTE — Telephone Encounter (Signed)
That is fine with me.

## 2022-10-30 NOTE — Telephone Encounter (Signed)
Patient is requesting to switch providers from Dr. Wyline Mood to Dr. Okey Dupre. Please advise.

## 2022-10-31 ENCOUNTER — Telehealth: Payer: Self-pay | Admitting: Licensed Clinical Social Worker

## 2022-10-31 NOTE — Telephone Encounter (Signed)
H&V Care Navigation CSW Progress Note  Clinical Social Worker contacted patient by phone to f/u on assistance applications. No answer at [phone], left voicemail requesting call back and letting pt know that I am still available for assistance even if he changes providers.   Patient is participating in a Managed Medicaid Plan:  No, self pay only.   SDOH Screenings   Food Insecurity: No Food Insecurity (10/18/2022)  Housing: Low Risk  (10/18/2022)  Transportation Needs: No Transportation Needs (10/18/2022)  Utilities: Not At Risk (10/18/2022)  Financial Resource Strain: Medium Risk (10/18/2022)  Tobacco Use: High Risk (10/29/2022)    Seth Ramirez, MSW, LCSW Clinical Social Worker II [facility] Heart/Vascular Care Navigation  [phone]- work cell phone (preferred) [phone]- desk phone

## 2022-11-11 ENCOUNTER — Telehealth (HOSPITAL_COMMUNITY): Payer: Self-pay | Admitting: Internal Medicine

## 2022-11-11 NOTE — Telephone Encounter (Signed)
Will route to ordering MD and RN as Lorain Childes.  Thanks!

## 2022-11-11 NOTE — Telephone Encounter (Signed)
Patient called and cancelled echocardiogram. I called pt to reschedule and he does not wish to do so at this time. Order will be removed from the echo WQ.

## 2022-11-13 ENCOUNTER — Other Ambulatory Visit (HOSPITAL_COMMUNITY): Payer: Self-pay

## 2022-11-14 ENCOUNTER — Telehealth: Payer: Self-pay | Admitting: Licensed Clinical Social Worker

## 2022-11-14 NOTE — Telephone Encounter (Signed)
H&V Care Navigation CSW Progress Note  Clinical Social Worker contacted patient by phone to f/u on assistance applications. No answer at [phone] again, left additional voicemail requesting call back and letting pt know that I am still available for assistance. I will not actively follow further at this time as pt has cancelled echo and not returned previous calls. Remain available if needed.   Patient is participating in a Managed Medicaid Plan:  No, self pay only.     SDOH Screenings   Food Insecurity: No Food Insecurity (10/18/2022)  Housing: Low Risk  (10/18/2022)  Transportation Needs: No Transportation Needs (10/18/2022)  Utilities: Not At Risk (10/18/2022)  Financial Resource Strain: Medium Risk (10/18/2022)  Tobacco Use: High Risk (10/29/2022)    Octavio Graves, MSW, LCSW Clinical Social Worker II [facility] Heart/Vascular Care Navigation  [phone]- work cell phone (preferred) [phone]- desk phone

## 2023-03-24 ENCOUNTER — Ambulatory Visit: Payer: Self-pay
